# Patient Record
Sex: Female | Born: 1985 | Race: Black or African American | Hispanic: No | Marital: Single | Smoking: Current every day smoker
Health system: Southern US, Community
[De-identification: ages and names within clinical notes are randomized; demographics above are authoritative.]

## PROBLEM LIST (undated history)

## (undated) DIAGNOSIS — E785 Hyperlipidemia, unspecified: Secondary | ICD-10-CM

## (undated) DIAGNOSIS — E119 Type 2 diabetes mellitus without complications: Secondary | ICD-10-CM

## (undated) DIAGNOSIS — K439 Ventral hernia without obstruction or gangrene: Secondary | ICD-10-CM

## (undated) DIAGNOSIS — I10 Essential (primary) hypertension: Secondary | ICD-10-CM

## (undated) HISTORY — DX: Essential (primary) hypertension: I10

## (undated) HISTORY — DX: Hyperlipidemia, unspecified: E78.5

## (undated) HISTORY — DX: Type 2 diabetes mellitus without complications: E11.9
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
## 2020-07-23 ENCOUNTER — Emergency Department
Admission: EM | Admit: 2020-07-23 | Discharge: 2020-07-23 | Disposition: A | Discharge status: Home / Self Care | Payer: Self-pay | Attending: Emergency Medicine | Admitting: Emergency Medicine

## 2020-07-23 ENCOUNTER — Inpatient Hospital Stay (HOSPITAL_COMMUNITY)
Admission: EM | Admit: 2020-07-23 | Discharge: 2020-07-29 | DRG: 083 | Disposition: A | Discharge status: Home / Self Care | Payer: No Typology Code available for payment source | Attending: General Surgery | Admitting: General Surgery

## 2020-07-23 ENCOUNTER — Other Ambulatory Visit: Payer: Self-pay

## 2020-07-23 ENCOUNTER — Emergency Department: Payer: Self-pay

## 2020-07-23 DIAGNOSIS — E1165 Type 2 diabetes mellitus with hyperglycemia: Secondary | ICD-10-CM | POA: Diagnosis present

## 2020-07-23 DIAGNOSIS — I10 Essential (primary) hypertension: Secondary | ICD-10-CM | POA: Diagnosis present

## 2020-07-23 DIAGNOSIS — R739 Hyperglycemia, unspecified: Secondary | ICD-10-CM | POA: Diagnosis present

## 2020-07-23 DIAGNOSIS — L0292 Furuncle, unspecified: Secondary | ICD-10-CM | POA: Diagnosis present

## 2020-07-23 DIAGNOSIS — H209 Unspecified iridocyclitis: Secondary | ICD-10-CM | POA: Diagnosis present

## 2020-07-23 DIAGNOSIS — H53141 Visual discomfort, right eye: Secondary | ICD-10-CM | POA: Diagnosis present

## 2020-07-23 DIAGNOSIS — M79646 Pain in unspecified finger(s): Secondary | ICD-10-CM

## 2020-07-23 DIAGNOSIS — L02214 Cutaneous abscess of groin: Secondary | ICD-10-CM | POA: Diagnosis present

## 2020-07-23 DIAGNOSIS — M542 Cervicalgia: Secondary | ICD-10-CM | POA: Diagnosis present

## 2020-07-23 DIAGNOSIS — Z23 Encounter for immunization: Secondary | ICD-10-CM | POA: Diagnosis not present

## 2020-07-23 DIAGNOSIS — Z6838 Body mass index (BMI) 38.0-38.9, adult: Secondary | ICD-10-CM | POA: Diagnosis not present

## 2020-07-23 DIAGNOSIS — E785 Hyperlipidemia, unspecified: Secondary | ICD-10-CM | POA: Diagnosis present

## 2020-07-23 DIAGNOSIS — F17210 Nicotine dependence, cigarettes, uncomplicated: Secondary | ICD-10-CM | POA: Diagnosis present

## 2020-07-23 DIAGNOSIS — R109 Unspecified abdominal pain: Secondary | ICD-10-CM | POA: Diagnosis not present

## 2020-07-23 DIAGNOSIS — Y92410 Unspecified street and highway as the place of occurrence of the external cause: Secondary | ICD-10-CM | POA: Diagnosis not present

## 2020-07-23 DIAGNOSIS — F17200 Nicotine dependence, unspecified, uncomplicated: Secondary | ICD-10-CM | POA: Diagnosis not present

## 2020-07-23 DIAGNOSIS — S0081XA Abrasion of other part of head, initial encounter: Secondary | ICD-10-CM | POA: Diagnosis present

## 2020-07-23 DIAGNOSIS — S066X9A Traumatic subarachnoid hemorrhage with loss of consciousness of unspecified duration, initial encounter: Principal | ICD-10-CM | POA: Diagnosis present

## 2020-07-23 DIAGNOSIS — I609 Nontraumatic subarachnoid hemorrhage, unspecified: Secondary | ICD-10-CM

## 2020-07-23 DIAGNOSIS — K439 Ventral hernia without obstruction or gangrene: Secondary | ICD-10-CM | POA: Diagnosis present

## 2020-07-23 DIAGNOSIS — R079 Chest pain, unspecified: Secondary | ICD-10-CM | POA: Insufficient documentation

## 2020-07-23 DIAGNOSIS — S0990XA Unspecified injury of head, initial encounter: Secondary | ICD-10-CM | POA: Insufficient documentation

## 2020-07-23 DIAGNOSIS — Z833 Family history of diabetes mellitus: Secondary | ICD-10-CM | POA: Diagnosis not present

## 2020-07-23 DIAGNOSIS — T1490XA Injury, unspecified, initial encounter: Secondary | ICD-10-CM

## 2020-07-23 HISTORY — DX: Ventral hernia without obstruction or gangrene: K43.9

## 2020-07-23 LAB — CBC
HCT: 38.8 % (ref 36.0–46.0)
Hemoglobin: 13.3 g/dL (ref 12.0–15.0)
MCH: 31.5 pg (ref 26.0–34.0)
MCHC: 34.3 g/dL (ref 30.0–36.0)
MCV: 91.9 fL (ref 80.0–100.0)
Platelets: 295 10*3/uL (ref 150–400)
RBC: 4.22 MIL/uL (ref 3.87–5.11)
RDW: 12.3 % (ref 11.5–15.5)
WBC: 6.6 10*3/uL (ref 4.0–10.5)
nRBC: 0 % (ref 0.0–0.2)

## 2020-07-23 LAB — TROPONIN I (HIGH SENSITIVITY)
Troponin I (High Sensitivity): 5 ng/L (ref <18)
Troponin I (High Sensitivity): 5 ng/L (ref <18)

## 2020-07-23 LAB — COMPREHENSIVE METABOLIC PANEL
ALT: 19 U/L (ref 0–44)
AST: 19 U/L (ref 15–41)
Albumin: 3.9 g/dL (ref 3.5–5.0)
Alkaline Phosphatase: 85 U/L (ref 38–126)
Anion gap: 11 (ref 5–15)
BUN: 7 mg/dL (ref 6–20)
CO2: 24 mmol/L (ref 22–32)
Calcium: 8.9 mg/dL (ref 8.9–10.3)
Chloride: 105 mmol/L (ref 98–111)
Creatinine, Ser: 0.61 mg/dL (ref 0.44–1.00)
GFR, Estimated: >60 mL/min (ref >60)
Glucose, Bld: 348 mg/dL — ABNORMAL HIGH (ref 70–99)
Potassium: 3.6 mmol/L (ref 3.5–5.1)
Sodium: 140 mmol/L (ref 135–145)
Total Bilirubin: 0.5 mg/dL (ref 0.3–1.2)
Total Protein: 7.6 g/dL (ref 6.5–8.1)

## 2020-07-23 LAB — APTT: aPTT: 29 seconds (ref 24–36)

## 2020-07-23 LAB — CBG MONITORING, ED: Glucose-Capillary: 190 mg/dL — ABNORMAL HIGH (ref 70–99)

## 2020-07-23 LAB — HEMOGLOBIN A1C
Hgb A1c MFr Bld: 9.7 % — ABNORMAL HIGH (ref 4.8–5.6)
Mean Plasma Glucose: 231.69 mg/dL

## 2020-07-23 LAB — PROTIME-INR
INR: 0.9 (ref 0.8–1.2)
Prothrombin Time: 11.7 seconds (ref 11.4–15.2)

## 2020-07-23 MED ORDER — ONDANSETRON HCL 4 MG/2ML IJ SOLN: 4.0000 mg | Freq: Four times a day (QID) | INTRAMUSCULAR | Status: DC | PRN

## 2020-07-23 MED ORDER — FENTANYL CITRATE (PF) 100 MCG/2ML IJ SOLN: 75.0000 ug | Freq: Once | INTRAMUSCULAR | Status: DC

## 2020-07-23 MED ORDER — BISACODYL 10 MG RE SUPP: 10.0000 mg | Freq: Every day | RECTAL | Status: DC | PRN

## 2020-07-23 MED ORDER — LACTATED RINGERS IV SOLN: INTRAVENOUS | Status: DC

## 2020-07-23 MED ORDER — LEVETIRACETAM IN NACL 500 MG/100ML IV SOLN
500.0000 mg | Freq: Two times a day (BID) | INTRAVENOUS | Status: DC
  Administered 2020-07-23 – 2020-07-28 (×10): 500 mg via INTRAVENOUS
  Filled 2020-07-23 (×12): qty 100

## 2020-07-23 MED ORDER — OXYCODONE HCL 5 MG PO TABS
5.0000 mg | ORAL_TABLET | ORAL | Status: DC | PRN
  Administered 2020-07-24 – 2020-07-26 (×3): 10 mg via ORAL
  Administered 2020-07-26: 5 mg via ORAL
  Administered 2020-07-27: 10 mg via ORAL
  Administered 2020-07-27: 5 mg via ORAL
  Administered 2020-07-28 – 2020-07-29 (×5): 10 mg via ORAL
  Filled 2020-07-23 (×5): qty 2
  Filled 2020-07-23: qty 1
  Filled 2020-07-23 (×4): qty 2
  Filled 2020-07-23: qty 1

## 2020-07-23 MED ORDER — MORPHINE SULFATE (PF) 2 MG/ML IV SOLN
1.0000 mg | INTRAVENOUS | Status: DC | PRN
  Administered 2020-07-23 – 2020-07-27 (×4): 2 mg via INTRAVENOUS
  Filled 2020-07-23 (×4): qty 1

## 2020-07-23 MED ORDER — PROCHLORPERAZINE EDISYLATE 10 MG/2ML IJ SOLN
5.0000 mg | Freq: Four times a day (QID) | INTRAMUSCULAR | Status: DC | PRN
  Filled 2020-07-23: qty 2

## 2020-07-23 MED ORDER — HYDRALAZINE HCL 20 MG/ML IJ SOLN: 10.0000 mg | INTRAMUSCULAR | Status: DC | PRN

## 2020-07-23 MED ORDER — IOHEXOL 300 MG/ML  SOLN
100.0000 mL | Freq: Once | INTRAMUSCULAR | Status: AC | PRN
  Administered 2020-07-23: 100 mL via INTRAVENOUS

## 2020-07-23 MED ORDER — PROCHLORPERAZINE MALEATE 10 MG PO TABS
10.0000 mg | ORAL_TABLET | Freq: Four times a day (QID) | ORAL | Status: DC | PRN
  Filled 2020-07-23: qty 1

## 2020-07-23 MED ORDER — ONDANSETRON 4 MG PO TBDP: 4.0000 mg | ORAL_TABLET | Freq: Four times a day (QID) | ORAL | Status: DC | PRN

## 2020-07-23 MED ORDER — ACETAMINOPHEN 325 MG PO TABS: 650.0000 mg | ORAL_TABLET | ORAL | Status: DC | PRN

## 2020-07-23 MED ORDER — INSULIN ASPART 100 UNIT/ML ~~LOC~~ SOLN
0.0000 [IU] | SUBCUTANEOUS | Status: DC
  Administered 2020-07-24: 1 [IU] via SUBCUTANEOUS
  Administered 2020-07-24: 2 [IU] via SUBCUTANEOUS
  Administered 2020-07-24: 3 [IU] via SUBCUTANEOUS
  Administered 2020-07-24 – 2020-07-25 (×2): 5 [IU] via SUBCUTANEOUS
  Administered 2020-07-25: 2 [IU] via SUBCUTANEOUS
  Administered 2020-07-25 (×2): 1 [IU] via SUBCUTANEOUS
  Administered 2020-07-25: 2 [IU] via SUBCUTANEOUS
  Administered 2020-07-25 – 2020-07-26 (×3): 3 [IU] via SUBCUTANEOUS
  Administered 2020-07-26: 1 [IU] via SUBCUTANEOUS
  Administered 2020-07-26 (×4): 2 [IU] via SUBCUTANEOUS
  Administered 2020-07-27 (×2): 5 [IU] via SUBCUTANEOUS
  Administered 2020-07-27: 2 [IU] via SUBCUTANEOUS
  Administered 2020-07-27: 1 [IU] via SUBCUTANEOUS

## 2020-07-23 MED ORDER — DOCUSATE SODIUM 100 MG PO CAPS
100.0000 mg | ORAL_CAPSULE | Freq: Two times a day (BID) | ORAL | Status: DC
  Administered 2020-07-24 – 2020-07-28 (×6): 100 mg via ORAL
  Filled 2020-07-23 (×10): qty 1

## 2020-07-23 MED ORDER — FENTANYL CITRATE (PF) 100 MCG/2ML IJ SOLN
50.0000 ug | Freq: Once | INTRAMUSCULAR | Status: AC
  Administered 2020-07-23: 50 ug via INTRAVENOUS
  Filled 2020-07-23: qty 2
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
## 2020-07-23 NOTE — ED Notes (Signed)
Patient signed paper consent to be transferred. Copy placed in patient chart.
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
## 2020-07-23 NOTE — ED Notes (Signed)
Spoke to radiology, patient cannot pass c-spine to be able to get up to provide urine sample. Patient refuses cath or bed pan. States that there is no chance at all she could be pregnant. Radiology to discuss waiver with patient.
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
## 2020-07-23 NOTE — ED Provider Notes (Signed)
Transport by Bud Face, MD 07/23/20 [phone]
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
## 2020-07-23 NOTE — ED Notes (Signed)
CBG was 385 per RN
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
## 2020-07-23 NOTE — ED Provider Notes (Signed)
Patient reports no past medical history.  No history of renal disease.  Denies pregnancy or any chance of pregnancy.  Based on her presentation, severity of pain, head injury, intra-abdominal pain and a high pretest probability for acute traumatic injury I have requested emergent CT imaging be performed   Sharyn Creamer, MD 07/23/20 1314

## 2020-07-24 ENCOUNTER — Inpatient Hospital Stay (HOSPITAL_COMMUNITY): Payer: No Typology Code available for payment source

## 2020-07-24 DIAGNOSIS — R030 Elevated blood-pressure reading, without diagnosis of hypertension: Secondary | ICD-10-CM

## 2020-07-24 DIAGNOSIS — E119 Type 2 diabetes mellitus without complications: Secondary | ICD-10-CM

## 2020-07-24 LAB — CBG MONITORING, ED
Glucose-Capillary: 139 mg/dL — ABNORMAL HIGH (ref 70–99)
Glucose-Capillary: 148 mg/dL — ABNORMAL HIGH (ref 70–99)
Glucose-Capillary: 203 mg/dL — ABNORMAL HIGH (ref 70–99)
Glucose-Capillary: 186 mg/dL — ABNORMAL HIGH (ref 70–99)
Glucose-Capillary: 179 mg/dL — ABNORMAL HIGH (ref 70–99)

## 2020-07-24 LAB — CBC
HCT: 41.8 % (ref 36.0–46.0)
Hemoglobin: 13.9 g/dL (ref 12.0–15.0)
MCH: 31.4 pg (ref 26.0–34.0)
MCHC: 33.3 g/dL (ref 30.0–36.0)
MCV: 94.6 fL (ref 80.0–100.0)
Platelets: 299 10*3/uL (ref 150–400)
RBC: 4.42 MIL/uL (ref 3.87–5.11)
RDW: 12.8 % (ref 11.5–15.5)
WBC: 9.6 10*3/uL (ref 4.0–10.5)
nRBC: 0 % (ref 0.0–0.2)

## 2020-07-24 LAB — GLUCOSE, CAPILLARY: Glucose-Capillary: 282 mg/dL — ABNORMAL HIGH (ref 70–99)

## 2020-07-24 LAB — BASIC METABOLIC PANEL
Anion gap: 11 (ref 5–15)
BUN: 6 mg/dL (ref 6–20)
CO2: 26 mmol/L (ref 22–32)
Calcium: 9.3 mg/dL (ref 8.9–10.3)
Chloride: 105 mmol/L (ref 98–111)
Creatinine, Ser: 0.83 mg/dL (ref 0.44–1.00)
GFR, Estimated: >60 mL/min (ref >60)
Glucose, Bld: 179 mg/dL — ABNORMAL HIGH (ref 70–99)
Potassium: 3.7 mmol/L (ref 3.5–5.1)
Sodium: 142 mmol/L (ref 135–145)

## 2020-07-24 LAB — HIV ANTIBODY (ROUTINE TESTING W REFLEX): HIV Screen 4th Generation wRfx: NONREACTIVE

## 2020-07-24 LAB — LIPID PANEL
Cholesterol: 213 mg/dL — ABNORMAL HIGH (ref 0–200)
HDL: 68 mg/dL (ref >40)
LDL Cholesterol: 118 mg/dL — ABNORMAL HIGH (ref 0–99)
Total CHOL/HDL Ratio: 3.1 RATIO
Triglycerides: 135 mg/dL (ref <150)
VLDL: 27 mg/dL (ref 0–40)

## 2020-07-24 MED ORDER — LIVING WELL WITH DIABETES BOOK
Freq: Once | Status: AC
  Filled 2020-07-24: qty 1

## 2020-07-24 MED ORDER — ACETAMINOPHEN 325 MG PO TABS
650.0000 mg | ORAL_TABLET | Freq: Four times a day (QID) | ORAL | Status: DC
  Administered 2020-07-24 – 2020-07-28 (×9): 650 mg via ORAL
  Filled 2020-07-24 (×13): qty 2

## 2020-07-24 MED ORDER — METHOCARBAMOL 500 MG PO TABS
500.0000 mg | ORAL_TABLET | Freq: Three times a day (TID) | ORAL | Status: DC
  Administered 2020-07-24 – 2020-07-29 (×16): 500 mg via ORAL
  Filled 2020-07-24 (×16): qty 1

## 2020-07-24 NOTE — Progress Notes (Addendum)
Inpatient Diabetes Program Recommendations  AACE/ADA: New Consensus Statement on Inpatient Glycemic Control (2015)  Target Ranges:  Prepandial:   less than 140 mg/dL      Peak postprandial:   less than 180 mg/dL (1-2 hours)      Critically ill patients:  140 - 180 mg/dL   Lab Results  Component Value Date   GLUCAP 148 (H) 07/24/2020   HGBA1C 9.7 (H) 07/23/2020    Review of Glycemic Control  Diabetes history: New-onset DM Outpatient Diabetes medications: None Current orders for Inpatient glycemic control: Novolog 0-9 units Q4H  New onset DM with HgbA1C of 9.7%. Ordered Living Well with Diabetes book  No more nausea - on CL diet.  Inpatient Diabetes Program Recommendations:     Agree with orders. As po intake increases, may need meal coverage insulin and/or low- dose basal insulin. Will need to f/u with PCP for new-onset DM.  Diabetes Coordinator will attempt to speak with pt when appropriate about new DM diagnosis. Will need to stress lifestyle modification with diet, exercise, weight loss to help to control blood sugars.   Follow.  Thank you. Ailene Ards, RD, LDN, CDE Inpatient Diabetes Coordinator [phone]  Addendum: Spoke with pt about new diagnosis of diabetes. Pt states she will need PCP to manage her diabetes. Has insurance through work. Long discussion on lifestyle modification, weight loss, exercise. Will need to check blood sugars at home and will likely need insulin.   Will follow-up on Mon, 11/15.  RV

## 2020-07-24 NOTE — ED Notes (Signed)
Lunch Tray Ordered @ 1135. 

## 2020-07-24 NOTE — ED Notes (Signed)
Called pharmacy to check on status of Keppra, was told they would resend at this time
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
## 2020-07-24 NOTE — Progress Notes (Signed)
CT brain reviewed this morning. No acute changes. Stable subarachnoid hemorrhage.   Patient is neurological improving, GCS 15 at this time. Sitting up and eating a meal. No focal weakness. Still right periOrbital edema.  No further neurosurgical intervention. Can complete seven days of Keppra and then discontinue. No neurosurgical follow up necessary.   Thank you for allowing me to participate in this patient's c are. Yeslin Delio, DO  Please don't hesitate to call with questions.
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
## 2020-07-24 NOTE — ED Notes (Signed)
Keppra requested from pharmacy at this time.

## 2020-07-24 NOTE — Progress Notes (Signed)
Patient arrived to 4E from ED. Patient oriented to room and staff. CCMD notified. Telemetry box applied. CHG bath completed. Pain medication given per patient request. Vital signs stable. Call bell in reach.  Kenard Gower, RN
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
## 2020-07-25 DIAGNOSIS — E785 Hyperlipidemia, unspecified: Secondary | ICD-10-CM

## 2020-07-25 DIAGNOSIS — E1169 Type 2 diabetes mellitus with other specified complication: Secondary | ICD-10-CM

## 2020-07-25 LAB — GLUCOSE, CAPILLARY
Glucose-Capillary: 155 mg/dL — ABNORMAL HIGH (ref 70–99)
Glucose-Capillary: 289 mg/dL — ABNORMAL HIGH (ref 70–99)

## 2020-07-25 LAB — URINALYSIS, ROUTINE W REFLEX MICROSCOPIC
Bilirubin Urine: NEGATIVE
Glucose, UA: 50 mg/dL — AB
Hgb urine dipstick: NEGATIVE
Ketones, ur: NEGATIVE mg/dL
Leukocytes,Ua: NEGATIVE
Nitrite: NEGATIVE
Protein, ur: NEGATIVE mg/dL
Specific Gravity, Urine: 1.01 (ref 1.005–1.030)
pH: 7 (ref 5.0–8.0)

## 2020-07-25 MED ORDER — INSULIN GLARGINE 100 UNIT/ML ~~LOC~~ SOLN
10.0000 [IU] | Freq: Every day | SUBCUTANEOUS | Status: DC
  Administered 2020-07-25 – 2020-07-27 (×3): 10 [IU] via SUBCUTANEOUS
  Filled 2020-07-25 (×5): qty 0.1

## 2020-07-25 MED ORDER — DIPHENHYDRAMINE HCL 25 MG PO CAPS
25.0000 mg | ORAL_CAPSULE | Freq: Four times a day (QID) | ORAL | Status: DC | PRN
  Administered 2020-07-25 – 2020-07-26 (×3): 25 mg via ORAL
  Filled 2020-07-25 (×3): qty 1

## 2020-07-25 MED ORDER — ATORVASTATIN CALCIUM 40 MG PO TABS
40.0000 mg | ORAL_TABLET | Freq: Every day | ORAL | Status: DC
  Administered 2020-07-25 – 2020-07-29 (×5): 40 mg via ORAL
  Filled 2020-07-25: qty 1
  Filled 2020-07-25: qty 4
  Filled 2020-07-25 (×3): qty 1

## 2020-07-25 MED ORDER — AMLODIPINE BESYLATE 5 MG PO TABS
5.0000 mg | ORAL_TABLET | Freq: Every day | ORAL | Status: DC
  Administered 2020-07-25 – 2020-07-26 (×2): 5 mg via ORAL
  Filled 2020-07-25 (×2): qty 1
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
## 2020-07-25 NOTE — Plan of Care (Signed)
Continue to monitor
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
## 2020-07-25 NOTE — Progress Notes (Signed)
Patient reports itching and burning all over. Similar incident occurred last night and was given benadryl. Spoke with pharmacist who states that her medications should not cause itching but it is individual based. No other symptoms. Benadryl given at this time. Patient said she also had some "seasoning" from the food tray last night and today and wonders if this could be the cause. I explained to patient to hold off on seasoning and see if that helps or happens again. Will continue to monitor.

## 2020-07-25 NOTE — Progress Notes (Signed)
Patient refused education today on checking her blood glucose and giving herself insulin. She states that her eyes are still swollen and can't see well enough. She states she will try tomorrow. No family at bedside to teach either.

## 2020-07-26 DIAGNOSIS — R739 Hyperglycemia, unspecified: Secondary | ICD-10-CM

## 2020-07-26 LAB — CBC
HCT: 39.9 % (ref 36.0–46.0)
Hemoglobin: 13.2 g/dL (ref 12.0–15.0)
MCH: 31.2 pg (ref 26.0–34.0)
MCHC: 33.1 g/dL (ref 30.0–36.0)
MCV: 94.3 fL (ref 80.0–100.0)
Platelets: 250 10*3/uL (ref 150–400)
RBC: 4.23 MIL/uL (ref 3.87–5.11)
RDW: 12.3 % (ref 11.5–15.5)
WBC: 7.2 10*3/uL (ref 4.0–10.5)
nRBC: 0 % (ref 0.0–0.2)

## 2020-07-26 LAB — BASIC METABOLIC PANEL
Anion gap: 8 (ref 5–15)
BUN: 8 mg/dL (ref 6–20)
CO2: 27 mmol/L (ref 22–32)
Calcium: 9 mg/dL (ref 8.9–10.3)
Chloride: 103 mmol/L (ref 98–111)
Creatinine, Ser: 0.85 mg/dL (ref 0.44–1.00)
GFR, Estimated: >60 mL/min (ref >60)
Glucose, Bld: 191 mg/dL — ABNORMAL HIGH (ref 70–99)
Potassium: 3.5 mmol/L (ref 3.5–5.1)
Sodium: 138 mmol/L (ref 135–145)

## 2020-07-26 MED ORDER — AMLODIPINE BESYLATE 10 MG PO TABS
10.0000 mg | ORAL_TABLET | Freq: Every day | ORAL | Status: DC
  Administered 2020-07-27 – 2020-07-29 (×3): 10 mg via ORAL
  Filled 2020-07-26 (×3): qty 1

## 2020-07-26 NOTE — Plan of Care (Signed)
Continue to monitor

## 2020-07-26 NOTE — TOC CAGE-AID Note (Signed)
Transition of Care [facility]) - CAGE-AID Screening   Patient Details  Name: Jasmine Short MRN: [id number] Date of Birth: [birth year]  Transition of Care [facility]) CM/SW Contact:    Emeterio Reeve, [place] Phone Number: 07/26/2020, 4:51 PM   Clinical Narrative:  CSW met with pt at bedside. CSW introduced self and explained role at the hospital.  Pt denies alcohol use. Pt denies substance use. Pt did not need any resources at this time.    CAGE-AID Screening:    Have You Ever Felt You Ought to Cut Down on Your Drinking or Drug Use?: No Have People Annoyed You By Critizing Your Drinking Or Drug Use?: No Have You Felt Bad Or Guilty About Your Drinking Or Drug Use?: No Have You Ever Had a Drink or Used Drugs First Thing In The Morning to Steady Your Nerves or to Get Rid of a Hangover?: No CAGE-AID Score: 0  Substance Abuse Education Offered: Yes     Blima Ledger, [place] Social Worker [phone]
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
## 2020-07-26 NOTE — Progress Notes (Signed)
Patient unable to do self injections or blood glucose checks today d/t her eyes. States they hurt and she cant open very much d/t light sensitivity. Patient states that she will continue to try daily and family will be here at discharge to teach how to help manage diabetes.
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
## 2020-07-27 LAB — MICROALBUMIN / CREATININE URINE RATIO
Creatinine, Urine: 63.4 mg/dL
Microalb Creat Ratio: <5 mg/g creat (ref 0–29)
Microalb, Ur: <3 ug/mL — ABNORMAL HIGH

## 2020-07-27 LAB — GLUCOSE, CAPILLARY
Glucose-Capillary: 292 mg/dL — ABNORMAL HIGH (ref 70–99)
Glucose-Capillary: 152 mg/dL — ABNORMAL HIGH (ref 70–99)
Glucose-Capillary: 210 mg/dL — ABNORMAL HIGH (ref 70–99)

## 2020-07-27 MED ORDER — ERYTHROMYCIN 5 MG/GM OP OINT
TOPICAL_OINTMENT | Freq: Four times a day (QID) | OPHTHALMIC | Status: DC
  Filled 2020-07-27: qty 3.5

## 2020-07-27 MED ORDER — LIVING WELL WITH DIABETES BOOK
Freq: Once | Status: AC
  Filled 2020-07-27: qty 1

## 2020-07-27 MED ORDER — INSULIN ASPART 100 UNIT/ML ~~LOC~~ SOLN
0.0000 [IU] | Freq: Every day | SUBCUTANEOUS | Status: DC
  Administered 2020-07-27: 2 [IU] via SUBCUTANEOUS

## 2020-07-27 MED ORDER — INSULIN GLARGINE 100 UNIT/ML ~~LOC~~ SOLN
18.0000 [IU] | Freq: Every day | SUBCUTANEOUS | Status: DC
  Administered 2020-07-28 – 2020-07-29 (×2): 18 [IU] via SUBCUTANEOUS
  Filled 2020-07-27 (×2): qty 0.18

## 2020-07-27 MED ORDER — ENOXAPARIN SODIUM 30 MG/0.3ML ~~LOC~~ SOLN
30.0000 mg | Freq: Two times a day (BID) | SUBCUTANEOUS | Status: DC
  Administered 2020-07-27 – 2020-07-29 (×4): 30 mg via SUBCUTANEOUS
  Filled 2020-07-27 (×4): qty 0.3

## 2020-07-27 MED ORDER — PREDNISOLONE ACETATE 1 % OP SUSP
1.0000 [drp] | Freq: Four times a day (QID) | OPHTHALMIC | Status: DC
  Administered 2020-07-27 – 2020-07-29 (×6): 1 [drp] via OPHTHALMIC
  Filled 2020-07-27: qty 5

## 2020-07-27 MED ORDER — INSULIN ASPART 100 UNIT/ML ~~LOC~~ SOLN
0.0000 [IU] | Freq: Three times a day (TID) | SUBCUTANEOUS | Status: DC
  Administered 2020-07-28: 5 [IU] via SUBCUTANEOUS
  Administered 2020-07-28 – 2020-07-29 (×4): 3 [IU] via SUBCUTANEOUS
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
## 2020-07-27 NOTE — Progress Notes (Signed)
Inpatient Diabetes Program Recommendations  AACE/ADA: New Consensus Statement on Inpatient Glycemic Control (2015)  Target Ranges:  Prepandial:   less than 140 mg/dL      Peak postprandial:   less than 180 mg/dL (1-2 hours)      Critically ill patients:  140 - 180 mg/dL   Lab Results  Component Value Date   GLUCAP 292 (H) 07/27/2020   HGBA1C 9.7 (H) 07/23/2020    Review of Glycemic Control  Inpatient Diabetes Program Recommendations:   Attempted to speak with patient educate on diabetes type 2 + insulin teaching to prepare patient if discharged home on insulin. Patient states "my eyes hurt too much to talk now" and did not uncover face @ this time. Patient did acknowledge she knew she had diagnosis of diabetes but not wanting to discuss further @ this time.  Thank you, Billy Fischer. Reka Wist, RN, MSN, CDE  Diabetes Coordinator Inpatient Glycemic Control Team Team Pager [phone] (8am-5pm) 07/27/2020 1:00 PM
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
## 2020-07-27 NOTE — Plan of Care (Signed)
°  RD consulted for nutrition education regarding diabetes.   Lab Results  Component Value Date   HGBA1C 9.7 (H) 07/23/2020    RD provided "Carbohydrate Counting for People with Diabetes" handout from the Academy of Nutrition and Dietetics. Discussed different food groups and their effects on blood sugar, emphasizing carbohydrate-containing foods. Provided list of carbohydrates and recommended serving sizes of common foods.  Discussed importance of controlled and consistent carbohydrate intake throughout the day. Provided examples of ways to balance meals/snacks and encouraged intake of high-fiber, whole grain complex carbohydrates. Teach back method used.  Patient endorses eating one meal with snacks daily. Meal consist of fast food restaurants such as [employer]. Patient typically drinks soda, juice, and water. Dicussed how to make meal balanced, which foods contain carbs, how to count carbs, and how to make appropriate beverage substitutions. Patient would benefit from further outpatient education.    Labs and medications reviewed. No further nutrition interventions warranted at this time. RD contact information provided. If additional nutrition issues arise, please re-consult RD.  Vanessa Kick RD, LDN Clinical Nutrition Pager listed in AMION

## 2020-07-27 NOTE — Progress Notes (Addendum)
Inpatient Diabetes Program Recommendations  AACE/ADA: New Consensus Statement on Inpatient Glycemic Control (2015)  Target Ranges:  Prepandial:   less than 140 mg/dL      Peak postprandial:   less than 180 mg/dL (1-2 hours)      Critically ill patients:  140 - 180 mg/dL   Lab Results  Component Value Date   GLUCAP 292 (H) 07/27/2020   HGBA1C 9.7 (H) 07/23/2020    Review of Glycemic Control Results for Jasmine Short, Jasmine Short (MRN [id number]) as of 07/27/2020 12:02  Ref. Range 07/27/2020 08:35  Glucose-Capillary Latest Ref Range: 70 - 99 mg/dL 128 (H)   Diabetes history: New onset DM Outpatient Diabetes medications: none Current orders for Inpatient glycemic control: Lantus 10 units QD, Novolog 0-9 units Q4H  Inpatient Diabetes Program Recommendations:    Consider increasing Lantus to 18 units QHS and changing correction to Novolog 0-15 units TID & HS.   Noted new onset and multiple attempts for insulin teaching per nursing. Secure chat sent to RN to continue to attempt, DM coordinator to plan to see patient today. LWWDM booklet ordered, TOC and dietitian consults placed. Also, discussed lack of CBGs with RN. Encouraged meter docking and to ensure correction is given based on fingerstick, not serum glucose. Following.    Thanks, Lujean Rave, MSN, RNC-OB Diabetes Coordinator [phone] (8a-5p)
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
## 2020-07-27 NOTE — Progress Notes (Signed)
Patient refusing medication and vitals at this time. Informed patient staff would return in 30 minutes.  Kenard Gower, RN
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
## 2020-07-28 LAB — GLUCOSE, CAPILLARY: Glucose-Capillary: 184 mg/dL — ABNORMAL HIGH (ref 70–99)

## 2020-07-28 MED ORDER — INFLUENZA VAC SPLIT QUAD 0.5 ML IM SUSY
0.5000 mL | PREFILLED_SYRINGE | INTRAMUSCULAR | Status: AC
  Administered 2020-07-29: 0.5 mL via INTRAMUSCULAR
  Filled 2020-07-28: qty 0.5

## 2020-07-28 MED ORDER — LIDOCAINE HCL (PF) 1 % IJ SOLN
INTRAMUSCULAR | Status: AC
  Filled 2020-07-28: qty 5

## 2020-07-28 MED ORDER — LIDOCAINE HCL 1 % IJ SOLN
5.0000 mL | Freq: Once | INTRAMUSCULAR | Status: DC
  Filled 2020-07-28: qty 5

## 2020-07-28 MED ORDER — LEVETIRACETAM 500 MG PO TABS
500.0000 mg | ORAL_TABLET | Freq: Two times a day (BID) | ORAL | Status: DC
  Administered 2020-07-28 – 2020-07-29 (×2): 500 mg via ORAL
  Filled 2020-07-28 (×2): qty 1

## 2020-07-28 MED ORDER — SULFAMETHOXAZOLE-TRIMETHOPRIM 800-160 MG PO TABS
1.0000 | ORAL_TABLET | Freq: Two times a day (BID) | ORAL | Status: DC
  Administered 2020-07-28 – 2020-07-29 (×2): 1 via ORAL
  Filled 2020-07-28 (×2): qty 1
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
## 2020-07-28 NOTE — TOC Progression Note (Signed)
Transition of Care (TOC) - Progression Note  Sander Radon, BSN Transitions of Care Unit 4E- RN Case Manager See Treatment Team for direct phone #    Patient Details  Name: Lakelyn Straus MRN: [id number] Date of Birth: [birth year]  Transition of Care [facility]) CM/SW Contact  Zenda Alpers Lenn Sink, RN Phone Number: 07/28/2020, 2:48 PM  Clinical Narrative:    Follow up done with pt for PCP needs- per pt she would like to find a primary care provider near [place] or [place]- confirmed address and phone # in epic. Per pt she has started new job in last 5 mo. And just reached 90 day mark for insurance - she will start insurance coverage when she leaves hospital and can go by HR at her job to get information. At this time all she knows is that it will be a Insurance underwriter.  Call made to several practices in/near [place]- however either they are not taking new patients, need more info on her insurance, or are out to Jan/first of year for new patient appointments- info provided to pt on some of the clinics and advised her to get the new insurance info as soon as possible that way she can find out who is in network with her insurance and find a provider close to [place] area. Pt voiced understanding and will f/u to find a provider and make an appointment as soon as possible.  Will need to use [facility] pharmacy for any new scripts- TOC to follow to see if pt will need MATCH assistance.    Expected Discharge Plan: Home/Self Care Barriers to Discharge: Continued Medical Work up  Expected Discharge Plan and Services Expected Discharge Plan: Home/Self Care   Discharge Planning Services: CM Consult, Indigent Health Clinic   Living arrangements for the past 2 months: Apartment                                       Social Determinants of Health (SDOH) Interventions    Readmission Risk Interventions No flowsheet data found.

## 2020-07-28 NOTE — Progress Notes (Signed)
Patient with new onset rash on arms and chest as well as new abscess on left groin. Patient states painful and itchy. Assessed by this nurse and Audley Hose, RN. Origin undetermined and physician notified. Awaiting response and will continue to monitor.   -Estella Husk, RN

## 2020-07-28 NOTE — Progress Notes (Addendum)
Inpatient Diabetes Program Recommendations  AACE/ADA: New Consensus Statement on Inpatient Glycemic Control (2015)  Target Ranges:  Prepandial:   less than 140 mg/dL      Peak postprandial:   less than 180 mg/dL (1-2 hours)      Critically ill patients:  140 - 180 mg/dL   Lab Results  Component Value Date   GLUCAP 184 (H) 07/28/2020   HGBA1C 9.7 (H) 07/23/2020    Review of Glycemic Control Results for RYELEIGH, SANTORE (MRN [id number]) as of 07/28/2020 11:55  Ref. Range 07/27/2020 08:35 07/27/2020 17:56 07/27/2020 21:45 07/28/2020 06:23  Glucose-Capillary Latest Ref Range: 70 - 99 mg/dL 292 (H) 152 (H) 210 (H) 184 (H)   Diabetes history: New onset DM Outpatient Diabetes medications: none Current orders for Inpatient glycemic control: Lantus 18 units QD, Novolog 0-15 units TID & HS  Inpatient Diabetes Program Recommendations:    Spoke with patient regarding new onset DM.  Reviewed patient's current A1c of 9.7%. Explained what a A1c is and what it measures. Also reviewed goal A1c with patient, importance of good glucose control @ home, and blood sugar goals. Reviewed at length DM, hypo vs hyper glycemia, interventions, Relion products, differences between long acting vs short acting insulin, Metformin/Glipizide, vascular changes and commorbidities.  Patient will need a meter at discharge. Blood glucose meter (inlcudes strips and lancets) #[id number]. Encouraged to being checking blood sugars 2-3 times per day and reviewed target goals and when to call MD.  Patient had recently discontinued consuming regular sodas, however, was continuing to drink large amounts of juice. Reviewed alternatives, plate method, reading nutritional labels, daily alottment, and importance of CHO mindfulness. Reviewed daily schedule and how to fit in meals and reviewed meal/snack options.  Educated patient on insulin pen use at home. Reviewed contents of insulin flexpen starter kit. Reviewed all steps if insulin pen  including attachment of needle, 2-unit air shot, dialing up dose, giving injection, removing needle, disposal of sharps, storage of unused insulin, disposal of insulin etc. Patient able to provide successful return demonstration. Also reviewed troubleshooting with insulin pen. MD to give patient Rxs for insulin pens and insulin pen needles. RN to plan to work with patient on self injection today and patient willing. Patient is planning to follow up with PCP, however, has multiple questions regarding insurance status. Reached out to [place], case management to help assist. This will also aide in plan for discharge, although, I suspect patient may benefit form oral agents and possible need for insulin. Patient has no further questions at this time.   Thanks, Bronson Curb, MSN, RNC-OB Diabetes Coordinator [phone] (8a-5p)
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
## 2020-07-29 ENCOUNTER — Other Ambulatory Visit (HOSPITAL_COMMUNITY): Payer: Self-pay | Admitting: Physician Assistant

## 2020-07-29 LAB — GLUCOSE, CAPILLARY: Glucose-Capillary: 168 mg/dL — ABNORMAL HIGH (ref 70–99)

## 2020-07-29 MED ORDER — METHOCARBAMOL 500 MG PO TABS: 500.0000 mg | ORAL_TABLET | Freq: Three times a day (TID) | ORAL | 0 refills | Status: DC | PRN

## 2020-07-29 MED ORDER — INSULIN GLARGINE 100 UNIT/ML ~~LOC~~ SOLN: 18.0000 [IU] | Freq: Every day | SUBCUTANEOUS | 11 refills | Status: AC

## 2020-07-29 MED ORDER — LEVETIRACETAM 500 MG PO TABS
500.0000 mg | ORAL_TABLET | Freq: Two times a day (BID) | ORAL | 0 refills | Status: DC
Start: 2020-07-29 — End: 2020-07-29

## 2020-07-29 MED ORDER — PREDNISOLONE ACETATE 1 % OP SUSP: 1.0000 [drp] | Freq: Four times a day (QID) | OPHTHALMIC | 0 refills | Status: DC

## 2020-07-29 MED ORDER — AMLODIPINE BESYLATE 10 MG PO TABS
10.0000 mg | ORAL_TABLET | Freq: Every day | ORAL | 0 refills | Status: DC
Start: 2020-07-30 — End: 2020-07-30

## 2020-07-29 MED ORDER — INSULIN ASPART 100 UNIT/ML ~~LOC~~ SOLN
0.0000 [IU] | Freq: Every day | SUBCUTANEOUS | 11 refills | Status: DC
Start: 2020-07-29 — End: 2020-11-06

## 2020-07-29 MED ORDER — ATORVASTATIN CALCIUM 40 MG PO TABS
40.0000 mg | ORAL_TABLET | Freq: Every day | ORAL | 0 refills | Status: DC
Start: 2020-07-30 — End: 2020-07-30

## 2020-07-29 MED ORDER — INSULIN ASPART 100 UNIT/ML ~~LOC~~ SOLN
0.0000 [IU] | Freq: Three times a day (TID) | SUBCUTANEOUS | 11 refills | Status: AC
Start: 2020-07-29 — End: ?

## 2020-07-29 MED ORDER — OXYCODONE HCL 5 MG PO TABS: 5.0000 mg | ORAL_TABLET | Freq: Four times a day (QID) | ORAL | 0 refills | Status: DC | PRN

## 2020-07-29 MED ORDER — BLOOD GLUCOSE METER KIT: PACK | 0 refills | Status: AC

## 2020-07-29 MED ORDER — ACETAMINOPHEN 325 MG PO TABS: 650.0000 mg | ORAL_TABLET | Freq: Four times a day (QID) | ORAL | Status: AC | PRN

## 2020-07-29 MED ORDER — SULFAMETHOXAZOLE-TRIMETHOPRIM 800-160 MG PO TABS
1.0000 | ORAL_TABLET | Freq: Two times a day (BID) | ORAL | 0 refills | Status: DC
Start: 2020-07-29 — End: 2020-08-11

## 2020-07-29 MED FILL — NOVOLOG FLEXPEN SYRINGE: 100 | 30 days supply | Qty: 15 | Fill #0

## 2020-07-29 MED FILL — PENTIPS 32G X 4 MM MISC: 32G X 4 MM | 30 days supply | Qty: 200 | Fill #0

## 2020-07-29 MED FILL — TRUEplus LANCETS 28G MISC: 25 days supply | Qty: 100 | Fill #0

## 2020-07-29 MED FILL — oxyCODONE HCL 5 MG TABS: 5 | 3 days supply | Qty: 15 | Fill #0

## 2020-07-29 MED FILL — METHOCARBAMOL 500 MG TABS: 500 | 10 days supply | Qty: 30 | Fill #0

## 2020-07-29 MED FILL — TRUE METRIX BLOOD GLUCOSE M: W/DEVICE | 1 days supply | Qty: 1 | Fill #0

## 2020-07-29 MED FILL — SULFAMETHOXAZOLE-TMP DS TAB: 800-160 | 6 days supply | Qty: 12 | Fill #0

## 2020-07-29 MED FILL — TRUE METRIX GLUCOSE TEST ST: 25 days supply | Qty: 100 | Fill #0

## 2020-07-29 MED FILL — ATORVASTATIN CALCIUM 40 MG: 40 | 30 days supply | Qty: 30 | Fill #0

## 2020-07-29 MED FILL — AMLODIPINE BESYLATE 10 MG T: 10 | 30 days supply | Qty: 30 | Fill #0

## 2020-07-29 MED FILL — levETIRAcetam 500 MG TABS: 500 | 5 days supply | Qty: 10 | Fill #0

## 2020-07-29 MED FILL — LANTUS SOLOSTAR 100 UNITS/M: 100 | 30 days supply | Qty: 6 | Fill #0
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
## 2020-07-29 NOTE — Progress Notes (Signed)
Patient discharged with all of her items including pharmacy medicatoins. Given discharge instructions and paperwork, instructed to follow up. IV discontinued per protocol.   -Estella Husk, RN

## 2020-07-29 NOTE — Plan of Care (Signed)

## 2020-07-29 NOTE — Discharge Instructions (Signed)
Carbohydrate Counting For People With Diabetes  Foods with carbohydrates make your blood glucose level go up. Learning how to count carbohydrates can help you control your blood glucose levels. First, identify the foods you eat that contain carbohydrates. Then, using the Foods with Carbohydrates chart, determine about how much carbohydrates are in your meals and snacks. Make sure you are eating foods with fiber, protein, and healthy fat along with your carbohydrate foods. Foods with Carbohydrates The following table shows carbohydrate foods that have about 15 grams of carbohydrate each. Using measuring cups, spoons, or a food scale when you first begin learning about carbohydrate counting can help you learn about the portion sizes you typically eat. The following foods have 15 grams carbohydrate each:  Grains . 1 slice bread (1 ounce)  . 1 small tortilla (6-inch size)  .  large bagel (1 ounce)  . 1/3 cup pasta or rice (cooked)  .  hamburger or hot dog bun ( ounce)  .  cup cooked cereal  .  to  cup ready-to-eat cereal  . 2 taco shells (5-inch size) Fruit . 1 small fresh fruit ( to 1 cup)  .  medium banana  . 17 small grapes (3 ounces)  . 1 cup melon or berries  .  cup canned or frozen fruit  . 2 tablespoons dried fruit (blueberries, cherries, cranberries, raisins)  .  cup unsweetened fruit juice  Starchy Vegetables .  cup cooked beans, peas, corn, potatoes/sweet potatoes  .  large baked potato (3 ounces)  . 1 cup acorn or butternut squash  Snack Foods . 3 to 6 crackers  . 8 potato chips or 13 tortilla chips ( ounce to 1 ounce)  . 3 cups popped popcorn  Dairy . 3/4 cup (6 ounces) nonfat plain yogurt, or yogurt with sugar-free sweetener  . 1 cup milk  . 1 cup plain rice, soy, coconut or flavored almond milk Sweets and Desserts .  cup ice cream or frozen yogurt  . 1 tablespoon jam, jelly, pancake syrup, table sugar, or honey  . 2 tablespoons light pancake syrup  . 1 inch  square of frosted cake or 2 inch square of unfrosted cake  . 2 small cookies (2/3 ounce each) or  large cookie  Sometimes you'll have to estimate carbohydrate amounts if you don't know the exact recipe. One cup of mixed foods like soups can have 1 to 2 carbohydrate servings, while some casseroles might have 2 or more servings of carbohydrate. Foods that have less than 20 calories in each serving can be counted as "free" foods. Count 1 cup raw vegetables, or  cup cooked non-starchy vegetables as "free" foods. If you eat 3 or more servings at one meal, then count them as 1 carbohydrate serving.  Foods without Carbohydrates  Not all foods contain carbohydrates. Meat, some dairy, fats, non-starchy vegetables, and many beverages don't contain carbohydrate. So when you count carbohydrates, you can generally exclude chicken, pork, beef, fish, seafood, eggs, tofu, cheese, butter, sour cream, avocado, nuts, seeds, olives, mayonnaise, water, black coffee, unsweetened tea, and zero-calorie drinks. Vegetables with no or low carbohydrate include green beans, cauliflower, tomatoes, and onions. How much carbohydrate should I eat at each meal?  Carbohydrate counting can help you plan your meals and manage your weight. Following are some starting points for carbohydrate intake at each meal. Work with your registered dietitian nutritionist to find the best range that works for your blood glucose and weight.   To Lose Weight To  Maintain Weight  Women 2 - 3 carb servings 3 - 4 carb servings  Men 3 - 4 carb servings 4 - 5 carb servings  Checking your blood glucose after meals will help you know if you need to adjust the timing, type, or number of carbohydrate servings in your meal plan. Achieve and keep a healthy body weight by balancing your food intake and physical activity.  Tips How should I plan my meals?  Plan for half the food on your plate to include non-starchy vegetables, like salad greens, broccoli, or  carrots. Try to eat 3 to 5 servings of non-starchy vegetables every day. Have a protein food at each meal. Protein foods include chicken, fish, meat, eggs, or beans (note that beans contain carbohydrate). These two food groups (non-starchy vegetables and proteins) are low in carbohydrate. If you fill up your plate with these foods, you will eat less carbohydrate but still fill up your stomach. Try to limit your carbohydrate portion to  of the plate.  What fats are healthiest to eat?  Diabetes increases risk for heart disease. To help protect your heart, eat more healthy fats, such as olive oil, nuts, and avocado. Eat less saturated fats like butter, cream, and high-fat meats, like bacon and sausage. Avoid trans fats, which are in all foods that list "partially hydrogenated oil" as an ingredient. What should I drink?  Choose drinks that are not sweetened with sugar. The healthiest choices are water, carbonated or seltzer waters, and tea and coffee without added sugars.  Sweet drinks will make your blood glucose go up very quickly. One serving of soda or energy drink is  cup. It is best to drink these beverages only if your blood glucose is low.  Artificially sweetened, or diet drinks, typically do not increase your blood glucose if they have zero calories in them. Read labels of beverages, as some diet drinks do have carbohydrate and will raise your blood glucose. Label Reading Tips Read Nutrition Facts labels to find out how many grams of carbohydrate are in a food you want to eat. Don't forget: sometimes serving sizes on the label aren't the same as how much food you are going to eat, so you may need to calculate how much carbohydrate is in the food you are serving yourself.   Carbohydrate Counting for People with Diabetes Sample 1-Day Menu  Breakfast  cup yogurt, low fat, low sugar (1 carbohydrate serving)   cup cereal, ready-to-eat, unsweetened (1 carbohydrate serving)  1 cup strawberries (1  carbohydrate serving)   cup almonds ( carbohydrate serving)  Lunch 1, 5 ounce can chunk light tuna  2 ounces cheese, low fat cheddar  6 whole wheat crackers (1 carbohydrate serving)  1 small apple (1 carbohydrate servings)   cup carrots ( carbohydrate serving)   cup snap peas  1 cup 1% milk (1 carbohydrate serving)   Evening Meal Stir fry made with: 3 ounces chicken  1 cup brown rice (3 carbohydrate servings)   cup broccoli ( carbohydrate serving)   cup green beans   cup onions  1 tablespoon olive oil  2 tablespoons teriyaki sauce ( carbohydrate serving)  Evening Snack 1 extra small banana (1 carbohydrate serving)  1 tablespoon peanut butter   Carbohydrate Counting for People with Diabetes Vegan Sample 1-Day Menu  Breakfast 1 cup cooked oatmeal (2 carbohydrate servings)   cup blueberries (1 carbohydrate serving)  2 tablespoons flaxseeds  1 cup soymilk fortified with calcium and vitamin D  1  cup coffee  Lunch 2 slices whole wheat bread (2 carbohydrate servings)   cup baked tofu   cup lettuce  2 slices tomato  2 slices avocado   cup baby carrots ( carbohydrate serving)  1 orange (1 carbohydrate serving)  1 cup soymilk fortified with calcium and vitamin D   Evening Meal Burrito made with: 1 6-inch corn tortilla (1 carbohydrate serving)  1 cup refried vegetarian beans (2 carbohydrate servings)   cup chopped tomatoes   cup lettuce   cup salsa  1/3 cup brown rice (1 carbohydrate serving)  1 tablespoon olive oil for rice   cup zucchini   Evening Snack 6 small whole grain crackers (1 carbohydrate serving)  2 apricots ( carbohydrate serving)   cup unsalted peanuts ( carbohydrate serving)    Carbohydrate Counting for People with Diabetes Vegetarian (Lacto-Ovo) Sample 1-Day Menu  Breakfast 1 cup cooked oatmeal (2 carbohydrate servings)   cup blueberries (1 carbohydrate serving)  2 tablespoons flaxseeds  1 egg  1 cup 1% milk (1 carbohydrate serving)  1  cup coffee  Lunch 2 slices whole wheat bread (2 carbohydrate servings)  2 ounces low-fat cheese   cup lettuce  2 slices tomato  2 slices avocado   cup baby carrots ( carbohydrate serving)  1 orange (1 carbohydrate serving)  1 cup unsweetened tea  Evening Meal Burrito made with: 1 6-inch corn tortilla (1 carbohydrate serving)   cup refried vegetarian beans (1 carbohydrate serving)   cup tomatoes   cup lettuce   cup salsa  1/3 cup brown rice (1 carbohydrate serving)  1 tablespoon olive oil for rice   cup zucchini  1 cup 1% milk (1 carbohydrate serving)  Evening Snack 6 small whole grain crackers (1 carbohydrate serving)  2 apricots ( carbohydrate serving)   cup unsalted peanuts ( carbohydrate serving)    Copyright 2020  [employer]. All rights reserved.  Using Nutrition Labels: Carbohydrate  . Serving Size  . Look at the serving size. All the information on the label is based on this portion. Randol Kern Per Container  . The number of servings contained in the package. . Guidelines for Carbohydrate  . Look at the total grams of carbohydrate in the serving size.  . 1 carbohydrate choice = 15 grams of carbohydrate. Range of Carbohydrate Grams Per Choice  Carbohydrate Grams/Choice Carbohydrate Choices  6-10   11-20 1   Blood Glucose Monitoring, Adult Monitoring your blood sugar (glucose) is an important part of managing your diabetes (diabetes mellitus). Blood glucose monitoring involves checking your blood glucose as often as directed and keeping a record (log) of your results over time. Checking your blood glucose regularly and keeping a blood glucose log can:  Help you and your health care provider adjust your diabetes management plan as needed, including your medicines or insulin.  Help you understand how food, exercise, illnesses, and medicines affect your blood glucose.  Let you know what your blood glucose is at any time. You can  quickly find out if you have low blood glucose (hypoglycemia) or high blood glucose (hyperglycemia). Your health care provider will set individualized treatment goals for you. Your goals will be based on your age, other medical conditions you have, and how you respond to diabetes treatment. Generally, the goal of treatment is to maintain the following blood glucose levels:  Before meals (preprandial): 80-130 mg/dL (4.4-7.2 mmol/L).  After meals (postprandial): below 180 mg/dL (10 mmol/L).  A1c level: less than  7%. Supplies needed:  Blood glucose meter.  Test strips for your meter. Each meter has its own strips. You must use the strips that came with your meter.  A needle to prick your finger (lancet). Do not use a lancet more than one time.  A device that holds the lancet (lancing device).  A journal or log book to write down your results. How to check your blood glucose  1. Wash your hands with soap and water. 2. Prick the side of your finger (not the tip) with the lancet. Use a different finger each time. 3. Gently rub the finger until a small drop of blood appears. 4. Follow instructions that come with your meter for inserting the test strip, applying blood to the strip, and using your blood glucose meter. 5. Write down your result and any notes. Some meters allow you to use areas of your body other than your finger (alternative sites) to test your blood. The most common alternative sites are:  Forearm.  Thigh.  Palm of the hand. If you think you may have hypoglycemia, or if you have a history of not knowing when your blood glucose is getting low (hypoglycemia unawareness), do not use alternative sites. Use your finger instead. Alternative sites may not be as accurate as the fingers, because blood flow is slower in these areas. This means that the result you get may be delayed, and it may be different from the result that you would get from your finger. Follow these instructions  at home: Blood glucose log   Every time you check your blood glucose, write down your result. Also write down any notes about things that may be affecting your blood glucose, such as your diet and exercise for the day. This information can help you and your health care provider: ? Look for patterns in your blood glucose over time. ? Adjust your diabetes management plan as needed.  Check if your meter allows you to download your records to a computer. Most glucose meters store a record of glucose readings in the meter. If you have type 1 diabetes:  Check your blood glucose 2 or more times a day.  Also check your blood glucose: ? Before every insulin injection. ? Before and after exercise. ? Before meals. ? 2 hours after a meal. ? Occasionally between 2:00 a.m. and 3:00 a.m., as directed. ? Before potentially dangerous tasks, like driving or using heavy machinery. ? At bedtime.  You may need to check your blood glucose more often, up to 6-10 times a day, if you: ? Use an insulin pump. ? Need multiple daily injections (MDI). ? Have diabetes that is not well-controlled. ? Are ill. ? Have a history of severe hypoglycemia. ? Have hypoglycemia unawareness. If you have type 2 diabetes:  If you take insulin or other diabetes medicines, check your blood glucose 2 or more times a day.  If you are on intensive insulin therapy, check your blood glucose 4 or more times a day. Occasionally, you may also need to check between 2:00 a.m. and 3:00 a.m., as directed.  Also check your blood glucose: ? Before and after exercise. ? Before potentially dangerous tasks, like driving or using heavy machinery.  You may need to check your blood glucose more often if: ? Your medicine is being adjusted. ? Your diabetes is not well-controlled. ? You are ill. General tips  Always keep your supplies with you.  If you have questions or need help, all blood glucose  meters have a 24-hour "hotline" phone  number that you can call. You may also contact your health care provider.  After you use a few boxes of test strips, adjust (calibrate) your blood glucose meter by following instructions that came with your meter. Contact a health care provider if:  Your blood glucose is at or above 240 mg/dL (13.3 mmol/L) for 2 days in a row.  You have been sick or have had a fever for 2 days or longer, and you are not getting better.  You have any of the following problems for more than 6 hours: ? You cannot eat or drink. ? You have nausea or vomiting. ? You have diarrhea. Get help right away if:  Your blood glucose is lower than 54 mg/dL (3 mmol/L).  You become confused or you have trouble thinking clearly.  You have difficulty breathing.  You have moderate or large ketone levels in your urine. Summary  Monitoring your blood sugar (glucose) is an important part of managing your diabetes (diabetes mellitus).  Blood glucose monitoring involves checking your blood glucose as often as directed and keeping a record (log) of your results over time.  Your health care provider will set individualized treatment goals for you. Your goals will be based on your age, other medical conditions you have, and how you respond to diabetes treatment.  Every time you check your blood glucose, write down your result. Also write down any notes about things that may be affecting your blood glucose, such as your diet and exercise for the day. This information is not intended to replace advice given to you by your health care provider. Make sure you discuss any questions you have with your health care provider. Document Revised: 06/22/2018 Document Reviewed: 02/08/2016 Elsevier Patient Education  [place].   Hypoglycemia Hypoglycemia is when the sugar (glucose) level in your blood is too low. Signs of low blood sugar may include:  Feeling: ? Hungry. ? Worried or nervous (anxious). ? Sweaty and  clammy. ? Confused. ? Dizzy. ? Sleepy. ? Sick to your stomach (nauseous).  Having: ? A fast heartbeat. ? A headache. ? A change in your vision. ? Tingling or no feeling (numbness) around your mouth, lips, or tongue. ? Jerky movements that you cannot control (seizure).  Having trouble with: ? Moving (coordination). ? Sleeping. ? Passing out (fainting). ? Getting upset easily (irritability). Low blood sugar can happen to people who have diabetes and people who do not have diabetes. Low blood sugar can happen quickly, and it can be an emergency. Treating low blood sugar Low blood sugar is often treated by eating or drinking something sugary right away, such as:  Fruit juice, 4-6 oz (120-150 mL).  Regular soda (not diet soda), 4-6 oz (120-150 mL).  Low-fat milk, 4 oz (120 mL).  Several pieces of hard candy.  Sugar or honey, 1 Tbsp (15 mL). Treating low blood sugar if you have diabetes If you can think clearly and swallow safely, follow the 15:15 rule:  Take 15 grams of a fast-acting carb (carbohydrate). Talk with your doctor about how much you should take.  Always keep a source of fast-acting carb with you, such as: ? Sugar tablets (glucose pills). Take 3-4 pills. ? 6-8 pieces of hard candy. ? 4-6 oz (120-150 mL) of fruit juice. ? 4-6 oz (120-150 mL) of regular (not diet) soda. ? 1 Tbsp (15 mL) honey or sugar.  Check your blood sugar 15 minutes after you take the  carb.  If your blood sugar is still at or below 70 mg/dL (3.9 mmol/L), take 15 grams of a carb again.  If your blood sugar does not go above 70 mg/dL (3.9 mmol/L) after 3 tries, get help right away.  After your blood sugar goes back to normal, eat a meal or a snack within 1 hour.  Treating very low blood sugar If your blood sugar is at or below 54 mg/dL (3 mmol/L), you have very low blood sugar (severe hypoglycemia). This may also cause:  Passing out.  Jerky movements you cannot control  (seizure).  Losing consciousness (coma). This is an emergency. Do not wait to see if the symptoms will go away. Get medical help right away. Call your local emergency services (911 in the [place] Do not drive yourself to the hospital. If you have very low blood sugar and you cannot eat or drink, you may need a glucagon shot (injection). A family member or friend should learn how to check your blood sugar and how to give you a glucagon shot. Ask your doctor if you need to have a glucagon shot kit at home. Follow these instructions at home: General instructions  Take over-the-counter and prescription medicines only as told by your doctor.  Stay aware of your blood sugar as told by your doctor.  Limit alcohol intake to no more than 1 drink a day for nonpregnant women and 2 drinks a day for men. One drink equals 12 oz of beer (355 mL), 5 oz of wine (148 mL), or 1 oz of hard liquor (44 mL).  Keep all follow-up visits as told by your doctor. This is important. If you have diabetes:   Follow your diabetes care plan as told by your doctor. Make sure you: ? Know the signs of low blood sugar. ? Take your medicines as told. ? Follow your exercise and meal plan. ? Eat on time. Do not skip meals. ? Check your blood sugar as often as told by your doctor. Always check it before and after exercise. ? Follow your sick day plan when you cannot eat or drink normally. Make this plan ahead of time with your doctor.  Share your diabetes care plan with: ? Your work or school. ? People you live with.  Check your pee (urine) for ketones: ? When you are sick. ? As told by your doctor.  Carry a card or wear jewelry that says you have diabetes. Contact a doctor if:  You have trouble keeping your blood sugar in your target range.  You have low blood sugar often. Get help right away if:  You still have symptoms after you eat or drink something sugary.  Your blood sugar is at or below 54 mg/dL (3  mmol/L).  You have jerky movements that you cannot control.  You pass out. These symptoms may be an emergency. Do not wait to see if the symptoms will go away. Get medical help right away. Call your local emergency services (911 in the [place] Do not drive yourself to the hospital. Summary  Hypoglycemia happens when the level of sugar (glucose) in your blood is too low.  Low blood sugar can happen to people who have diabetes and people who do not have diabetes. Low blood sugar can happen quickly, and it can be an emergency.  Make sure you know the signs of low blood sugar and know how to treat it.  Always keep a source of sugar (fast-acting carb) with you to  treat low blood sugar. This information is not intended to replace advice given to you by your health care provider. Make sure you discuss any questions you have with your health care provider. Document Revised: 12/20/2018 Document Reviewed: 10/02/2015 Elsevier Patient Education  [place].  Hyperglycemia Hyperglycemia occurs when the level of sugar (glucose) in the blood is too high. Glucose is a type of sugar that provides the body's main source of energy. Certain hormones (insulin and glucagon) control the level of glucose in the blood. Insulin lowers blood glucose, and glucagon increases blood glucose. Hyperglycemia can result from having too little insulin in the bloodstream, or from the body not responding normally to insulin. Hyperglycemia occurs most often in people who have diabetes (diabetes mellitus), but it can happen in people who do not have diabetes. It can develop quickly, and it can be life-threatening if it causes you to become severely dehydrated (diabetic ketoacidosis or hyperglycemic hyperosmolar state). Severe hyperglycemia is a medical emergency. What are the causes? If you have diabetes, hyperglycemia may be caused by:  Diabetes medicine.  Medicines that increase blood glucose or affect your diabetes  control.  Not eating enough, or not eating often enough.  Changes in physical activity level.  Being sick or having an infection. If you have prediabetes or undiagnosed diabetes:  Hyperglycemia may be caused by those conditions. If you do not have diabetes, hyperglycemia may be caused by:  Certain medicines, including steroid medicines, beta-blockers, epinephrine, and thiazide diuretics.  Stress.  Serious illness.  Surgery.  Diseases of the pancreas.  Infection. What increases the risk? Hyperglycemia is more likely to develop in people who have risk factors for diabetes, such as:  Having a family member with diabetes.  Having a gene for type 1 diabetes that is passed from parent to child (inherited).  Living in an area with cold weather conditions.  Exposure to certain viruses.  Certain conditions in which the body's disease-fighting (immune) system attacks itself (autoimmune disorders).  Being overweight or obese.  Having an inactive (sedentary) lifestyle.  Having been diagnosed with insulin resistance.  Having a history of prediabetes, gestational diabetes, or polycystic ovarian syndrome (PCOS).  Being of American-Indian, African-American, Hispanic/Latino, or Asian/Pacific Islander descent. What are the signs or symptoms? Hyperglycemia may not cause any symptoms. If you do have symptoms, they may include early warning signs, such as:  Increased thirst.  Hunger.  Feeling very tired.  Needing to urinate more often than usual.  Blurry vision. Other symptoms may develop if hyperglycemia gets worse, such as:  Dry mouth.  Loss of appetite.  Fruity-smelling breath.  Weakness.  Unexpected or rapid weight gain or weight loss.  Tingling or numbness in the hands or feet.  Headache.  Skin that does not quickly return to normal after being lightly pinched and released (poor skin turgor).  Abdominal pain.  Cuts or bruises that are slow to heal. How is  this diagnosed? Hyperglycemia is diagnosed with a blood test to measure your blood glucose level. This blood test is usually done while you are having symptoms. Your health care provider may also do a physical exam and review your medical history. You may have more tests to determine the cause of your hyperglycemia, such as:  A fasting blood glucose (FBG) test. You will not be allowed to eat (you will fast) for at least 8 hours before a blood sample is taken.  An A1c (hemoglobin A1c) blood test. This provides information about blood glucose control over  the previous 2-3 months.  An oral glucose tolerance test (OGTT). This measures your blood glucose at two times: ? After fasting. This is your baseline blood glucose level. ? Two hours after drinking a beverage that contains glucose. How is this treated? Treatment depends on the cause of your hyperglycemia. Treatment may include:  Taking medicine to regulate your blood glucose levels. If you take insulin or other diabetes medicines, your medicine or dosage may be adjusted.  Lifestyle changes, such as exercising more, eating healthier foods, or losing weight.  Treating an illness or infection, if this caused your hyperglycemia.  Checking your blood glucose more often.  Stopping or reducing steroid medicines, if these caused your hyperglycemia. If your hyperglycemia becomes severe and it results in hyperglycemic hyperosmolar state, you must be hospitalized and given IV fluids. Follow these instructions at home:  General instructions  Take over-the-counter and prescription medicines only as told by your health care provider.  Do not use any products that contain nicotine or tobacco, such as cigarettes and e-cigarettes. If you need help quitting, ask your health care provider.  Limit alcohol intake to no more than 1 drink per day for nonpregnant women and 2 drinks per day for men. One drink equals 12 oz of beer, 5 oz of wine, or 1 oz of  hard liquor.  Learn to manage stress. If you need help with this, ask your health care provider.  Keep all follow-up visits as told by your health care provider. This is important. Eating and drinking   Maintain a healthy weight.  Exercise regularly, as directed by your health care provider.  Stay hydrated, especially when you exercise, get sick, or spend time in hot temperatures.  Eat healthy foods, such as: ? Lean proteins. ? Complex carbohydrates. ? Fresh fruits and vegetables. ? Low-fat dairy products. ? Healthy fats.  Drink enough fluid to keep your urine clear or pale yellow. If you have diabetes:  Make sure you know the symptoms of hyperglycemia.  Follow your diabetes management plan, as told by your health care provider. Make sure you: ? Take your insulin and medicines as directed. ? Follow your exercise plan. ? Follow your meal plan. Eat on time, and do not skip meals. ? Check your blood glucose as often as directed. Make sure to check your blood glucose before and after exercise. If you exercise longer or in a different way than usual, check your blood glucose more often. ? Follow your sick day plan whenever you cannot eat or drink normally. Make this plan in advance with your health care provider.  Share your diabetes management plan with people in your workplace, school, and household.  Check your urine for ketones when you are ill and as told by your health care provider.  Carry a medical alert card or wear medical alert jewelry. Contact a health care provider if:  Your blood glucose is at or above 240 mg/dL (13.3 mmol/L) for 2 days in a row.  You have problems keeping your blood glucose in your target range.  You have frequent episodes of hyperglycemia. Get help right away if:  You have difficulty breathing.  You have a change in how you think, feel, or act (mental status).  You have nausea or vomiting that does not go away. These symptoms may  represent a serious problem that is an emergency. Do not wait to see if the symptoms will go away. Get medical help right away. Call your local emergency services (911 in  the [place] Do not drive yourself to the hospital. Summary  Hyperglycemia occurs when the level of sugar (glucose) in the blood is too high.  Hyperglycemia is diagnosed with a blood test to measure your blood glucose level. This blood test is usually done while you are having symptoms. Your health care provider may also do a physical exam and review your medical history.  If you have diabetes, follow your diabetes management plan as told by your health care provider.  Contact your health care provider if you have problems keeping your blood glucose in your target range. This information is not intended to replace advice given to you by your health care provider. Make sure you discuss any questions you have with your health care provider. Document Revised: 05/16/2016 Document Reviewed: 05/16/2016 Elsevier Patient Education  [place].  Hemoglobin A1c Test Why am I having this test? You may have the hemoglobin A1c test (HbA1c test) done to:  Evaluate your risk for developing diabetes (diabetes mellitus).  Diagnose diabetes.  Monitor long-term control of blood sugar (glucose) in people who have diabetes and help make treatment decisions. This test may be done with other blood glucose tests, such as fasting blood glucose and oral glucose tolerance tests. What is being tested? Hemoglobin is a type of protein in the blood that carries oxygen. Glucose attaches to hemoglobin to form glycated hemoglobin. This test checks the amount of glycated hemoglobin in your blood, which is a good indicator of the average amount of glucose in your blood during the past 2-3 months. What kind of sample is taken?  A blood sample is required for this test. It is usually collected by inserting a needle into a blood vessel. Tell a health  care provider about:  All medicines you are taking, including vitamins, herbs, eye drops, creams, and over-the-counter medicines.  Any blood disorders you have.  Any surgeries you have had.  Any medical conditions you have.  Whether you are pregnant or may be pregnant. How are the results reported? Your results will be reported as a percentage that indicates how much of your hemoglobin has glucose attached to it (is glycated). Your health care provider will compare your results to normal ranges that were established after testing a large group of people (reference ranges). Reference ranges may vary among labs and hospitals. For this test, common reference ranges are:  Adult or child without diabetes: 4-5.6%.  Adult or child with diabetes and good blood glucose control: less than 7%. What do the results mean? If you have diabetes:  A result of less than 7% is considered normal, meaning that your blood glucose is well controlled.  A result higher than 7% means that your blood glucose is not well controlled, and your treatment plan may need to be adjusted. If you do not have diabetes:  A result within the reference range is considered normal, meaning that you are not at high risk for diabetes.  A result of 5.7-6.4% means that you have a high risk of developing diabetes, and you may have prediabetes. Prediabetes is the condition of having a blood glucose level that is higher than it should be, but not high enough for you to be diagnosed with diabetes. Having prediabetes puts you at risk for developing type 2 diabetes (type 2 diabetes mellitus). You may have more tests, including a repeat HbA1c test.  Results of 6.5% or higher on two separate HbA1c tests mean that you have diabetes. You may have more tests  to confirm the diagnosis. Abnormally low HbA1c values may be caused by:  Pregnancy.  Severe blood loss.  Receiving donated blood (transfusions).  Low red blood cell count  (anemia).  Long-term kidney failure.  Some unusual forms (variants) of hemoglobin. Talk with your health care provider about what your results mean. Questions to ask your health care provider Ask your health care provider, or the department that is doing the test:  When will my results be ready?  How will I get my results?  What are my treatment options?  What other tests do I need?  What are my next steps? Summary  The hemoglobin A1c test (HbA1c test) may be done to evaluate your risk for developing diabetes, to diagnose diabetes, and to monitor long-term control of blood sugar (glucose) in people who have diabetes and help make treatment decisions.  Hemoglobin is a type of protein in the blood that carries oxygen. Glucose attaches to hemoglobin to form glycated hemoglobin. This test checks the amount of glycated hemoglobin in your blood, which is a good indicator of the average amount of glucose in your blood during the past 2-3 months.  Talk with your health care provider about what your results mean. This information is not intended to replace advice given to you by your health care provider. Make sure you discuss any questions you have with your health care provider. Document Revised: 08/11/2017 Document Reviewed: 04/11/2017 Elsevier Patient Education  [place]. 21-25 1  26-35 2  36-40 2  41-50 3  51-55 3  56-65 4  66-70 4  71-80 5    Copyright 2020  Academy of Nutrition and Dietetics. All rights reserved. [facility] [phone]) 1. Dr. Philemon Kingdom 2. Dr. Janie [facility] [phone]) 1. Dr. Delrae Rend [facility] [phone]) 1. Dr. Jacelyn Pi 2. Dr. Anda Kraft [facility] [phone]) 1. Dr. Daneil Dolin [facility] [phone]) [[place] office]  [phone]) [[place] office] 1. Dr. Lenna Sciara [place] 2. Dr. Mee Hives [facility]) [phone]) 1. Autumn Hudnall Ronnald Ramp), PA 2. Dr. Amalia Greenhouse 3. Dr. Marsh Dolly. [facility] [phone]) 1. Dr. Glade Lloyd [facility]  [phone]) 1. Dr. Orville Govern 2. Dr. Lelon Huh 3. Dr. Jerelene Redden 4. Alwyn Ren, FNP Dr. Carolynn Serve. [facility] [place] [phone])    Subarachnoid Hemorrhage  Subarachnoid hemorrhage is bleeding between the brain and the layer that covers the brain. The bleeding puts pressure on the brain, and it stops blood from going to some areas of the brain. If this bleeding is not treated, it may cause brain damage, stroke, or death. This is an emergency. You must be treated in the hospital right away. You are more likely to get this condition if you:  Smoke.  Have high blood pressure.  Drink too much alcohol.  Are older than age 76.  Are female, especially if you have stopped getting your period for a year or longer (menopause).  Have a family history of burst blood vessels (aneurysms).  Have a certain syndrome that leads to one of these: ? Kidney disease. ? Disease of tissues like bones, blood, and fat (connective tissues). Signs of this bleeding condition include:  Sudden, very bad headache. It may feel like the worst headache you have ever had.  Feeling sick to your stomach (nausea) or throwing up (vomiting), especially if you have other signs such as a headache.  Sudden weakness  or loss of feeling (numbness) in your face, arm, or leg, especially on one side of the body.  Sudden trouble with any of these: ? Walking. ? Moving an arm or leg. ? Talking. ? Understanding what people say. ? Swallowing. ? Seeing out of one eye or both eyes.  Sudden confusion.  Seeing double.  Loss of balance.  Sensitivity to light.  Stiff neck.  Trouble staying awake.  Passing out (fainting). Follow  these instructions at home: Medicines  Take over-the-counter and prescription medicines only as told by your doctor.  Do not take any medicines that contain aspirin or NSAIDs (like ibuprofen) unless your doctor says that it is safe to take them. Lifestyle  Do not use any products that have nicotine or tobacco. These include cigarettes and e-cigarettes. If you need help quitting, ask your doctor.  Limit alcohol to 1 drink a day for nonpregnant women and 2 drinks a day for men. One drink is equal to: ? 12 oz of beer. ? 5 oz of wine. ? 1 oz of hard liquor. Eating and drinking  Ask your doctor if it is safe for you to eat and drink. You may need tests to make sure that you can swallow safely (swallow studies). Driving  Do not drive until your doctor says that it is safe to drive.  Do not drive or use heavy machinery while taking prescription pain medicine. General instructions  Do therapy as recommended. This may include: ? Physical therapy (PT). ? Occupational therapy (OT). ? Speech-language therapy.  Rest and limit activity as told by your doctor. Rest helps your brain to heal. Make sure you: ? Get plenty of sleep. ? Avoid activities that cause stress to your body or mind.  Check your blood pressure as told by your doctor. Write down your blood pressure.  Keep all follow-up visits as told by your doctors. This is important. Contact a doctor if:  You have a stiff neck.  You have a cough.  You have a fever. Get help right away if:  You have any signs of a stroke. "BE FAST" is an easy way to remember the main warning signs: ? B - Balance. Signs are dizziness, sudden trouble walking, or loss of balance. ? E - Eyes. Signs are trouble seeing or a sudden change in how you see. ? F - Face. Signs are sudden weakness or loss of feeling of the face, or the face or eyelid drooping on one side. ? A - Arms. Signs are weakness or loss of feeling in an arm. This happens suddenly and  usually on one side of the body. ? S - Speech. Signs are sudden trouble speaking, slurred speech, or trouble understanding what people say. ? T - Time. Time to call emergency services. Write down what time symptoms started.  You have other signs of a stroke, such as: ? A sudden, very bad headache with no known cause. ? Feeling sick to your stomach. ? Throwing up. ? Jerky movements you cannot control (seizure). These symptoms may be an emergency. Do not wait to see if the symptoms will go away. Get medical help right away. Call your local emergency services (911 in the [place] Do not drive yourself to the hospital. Summary  Subarachnoid hemorrhage is bleeding in the brain. It is an emergency. You must be treated in the hospital right away.  Follow instructions from your doctor about eating, resting, and taking medicines.  Do not take any medicines that contain aspirin  or NSAIDs (like ibuprofen) unless your doctor says that it is safe to take them. This information is not intended to replace advice given to you by your health care provider. Make sure you discuss any questions you have with your health care provider. Document Revised: 08/11/2017 Document Reviewed: 06/08/2017 Elsevier Patient Education  2020 Elsevier Inc.    Incision and Drainage, Care After This sheet gives you information about how to care for yourself after your procedure. Your health care provider may also give you more specific instructions. If you have problems or questions, contact your health care provider. What can I expect after the procedure? After the procedure, it is common to have:  Pain or discomfort around the incision site.  Blood, fluid, or pus (drainage) from the incision.  Redness and firm skin around the incision site. Follow these instructions at home: Medicines  Take over-the-counter and prescription medicines only as told by your health care provider.  If you were prescribed an antibiotic  medicine, use or take it as told by your health care provider. Do not stop using the antibiotic even if you start to feel better. Wound care Follow instructions from your health care provider about how to take care of your wound. Make sure you:  Wash your hands with soap and water before and after you change your bandage (dressing). If soap and water are not available, use hand sanitizer.  Change your dressing and packing as told by your health care provider. ? If your dressing is dry or stuck when you try to remove it, moisten or wet the dressing with saline or water so that it can be removed without harming your skin or tissues. ? If your wound is packed, leave it in place until your health care provider tells you to remove it. To remove the packing, moisten or wet the packing with saline or water so that it can be removed without harming your skin or tissues.  Leave stitches (sutures), skin glue, or adhesive strips in place. These skin closures may need to stay in place for 2 weeks or longer. If adhesive strip edges start to loosen and curl up, you may trim the loose edges. Do not remove adhesive strips completely unless your health care provider tells you to do that. Check your wound every day for signs of infection. Check for:  More redness, swelling, or pain.  More fluid or blood.  Warmth.  Pus or a bad smell. If you were sent home with a drain tube in place, follow instructions from your health care provider about:  How to empty it.  How to care for it at home.  General instructions  Rest the affected area.  Do not take baths, swim, or use a hot tub until your health care provider approves. Ask your health care provider if you may take showers. You may only be allowed to take sponge baths.  Return to your normal activities as told by your health care provider. Ask your health care provider what activities are safe for you. Your health care provider may put you on activity or  lifting restrictions.  The incision will continue to drain. It is normal to have some clear or slightly bloody drainage. The amount of drainage should lessen each day.  Do not apply any creams, ointments, or liquids unless you have been told to by your health care provider.  Keep all follow-up visits as told by your health care provider. This is important. Contact a health care provider  if:  Your cyst or abscess returns.  You have a fever or chills.  You have more redness, swelling, or pain around your incision.  You have more fluid or blood coming from your incision.  Your incision feels warm to the touch.  You have pus or a bad smell coming from your incision.  You have red streaks above or below the incision site. Get help right away if:  You have severe pain or bleeding.  You cannot eat or drink without vomiting.  You have decreased urine output.  You become short of breath.  You have chest pain.  You cough up blood.  The affected area becomes numb or starts to tingle. These symptoms may represent a serious problem that is an emergency. Do not wait to see if the symptoms will go away. Get medical help right away. Call your local emergency services (911 in the [place] Do not drive yourself to the hospital. Summary  After this procedure, it is common to have fluid, blood, or pus coming from the surgery site.  Follow all home care instructions. You will be told how to take care of your incision, how to check for infection, and how to take medicines.  If you were prescribed an antibiotic medicine, take it as told by your health care provider. Do not stop taking the antibiotic even if you start to feel better.  Contact a health care provider if you have increased redness, swelling, or pain around your incision. Get help right away if you have chest pain, you vomit, you cough up blood, or you have shortness of breath.  Keep all follow-up visits as told by your health care  provider. This is important. This information is not intended to replace advice given to you by your health care provider. Make sure you discuss any questions you have with your health care provider. Document Revised: 07/30/2018 Document Reviewed: 07/30/2018 Elsevier Patient Education  2020 [employer].

## 2020-07-29 NOTE — TOC Transition Note (Addendum)
Transition of Care (TOC) - CM/SW Discharge Note   Patient Details  Name: Jasmine Short MRN: [id number] Date of Birth: 05/07/1986  Transition of Care (TOC) CM/SW Contact:  Amerson, Julie M, RN Phone Number: 07/29/2020, 3:01 PM   Clinical Narrative: Pt medically stable for discharge home today with family.  Met with pt with PA present; she is agreeable now to setting up PCP follow up appointment at [facility], in case she is not able to secure a PCP of her own with her new insurance.  Pt is uninsured, but is eligible for medication assistance through [facility] MATCH program. DC Rx will be sent to TOC pharmacy, to be filled using MATCH letter.  Pt appreciative of assistance.  Speech therapy recommending OP f/u; referral made to [facility] main campus rehab for follow up.  Rehab center will call pt for follow up appointment. Pt declines need for tub bench.      Final next level of care: OP Rehab Barriers to Discharge: Barriers Resolved   Patient Goals and CMS Choice Patient states their goals for this hospitalization and ongoing recovery are:: return home                             Discharge Plan and Services   Discharge Planning Services: Follow-up appt scheduled, Indigent Health Clinic, MATCH Program, Medication Assistance                                 Social Determinants of Health (SDOH) Interventions     Readmission Risk Interventions Readmission Risk Prevention Plan 07/29/2020  Post Dischage Appt Complete  Medication Screening Complete  Transportation Screening Complete   Julie W. Amerson, RN, BSN  Trauma/Neuro ICU Case Manager [phone]   
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
## 2020-08-02 LAB — AEROBIC CULTURE W GRAM STAIN (SUPERFICIAL SPECIMEN): Gram Stain: NONE SEEN

## 2020-08-03 ENCOUNTER — Inpatient Hospital Stay: Payer: Self-pay | Admitting: Family Medicine

## 2020-08-05 LAB — GLUCOSE, CAPILLARY
Glucose-Capillary: 136 mg/dL — ABNORMAL HIGH (ref 70–99)
Glucose-Capillary: 143 mg/dL — ABNORMAL HIGH (ref 70–99)
Glucose-Capillary: 148 mg/dL — ABNORMAL HIGH (ref 70–99)
Glucose-Capillary: 151 mg/dL — ABNORMAL HIGH (ref 70–99)
Glucose-Capillary: 164 mg/dL — ABNORMAL HIGH (ref 70–99)
Glucose-Capillary: 166 mg/dL — ABNORMAL HIGH (ref 70–99)
Glucose-Capillary: 169 mg/dL — ABNORMAL HIGH (ref 70–99)
Glucose-Capillary: 174 mg/dL — ABNORMAL HIGH (ref 70–99)
Glucose-Capillary: 175 mg/dL — ABNORMAL HIGH (ref 70–99)
Glucose-Capillary: 182 mg/dL — ABNORMAL HIGH (ref 70–99)
Glucose-Capillary: 190 mg/dL — ABNORMAL HIGH (ref 70–99)
Glucose-Capillary: 191 mg/dL — ABNORMAL HIGH (ref 70–99)
Glucose-Capillary: 199 mg/dL — ABNORMAL HIGH (ref 70–99)
Glucose-Capillary: 208 mg/dL — ABNORMAL HIGH (ref 70–99)
Glucose-Capillary: 213 mg/dL — ABNORMAL HIGH (ref 70–99)
Glucose-Capillary: 239 mg/dL — ABNORMAL HIGH (ref 70–99)
Glucose-Capillary: 247 mg/dL — ABNORMAL HIGH (ref 70–99)

## 2020-08-11 ENCOUNTER — Encounter: Payer: Self-pay | Attending: Family Medicine | Admitting: *Deleted

## 2020-08-11 ENCOUNTER — Other Ambulatory Visit: Payer: Self-pay

## 2020-08-11 ENCOUNTER — Encounter: Payer: Self-pay | Admitting: *Deleted

## 2020-08-11 VITALS — BP 126/80 | Ht 69.0 in | Wt 258.3 lb

## 2020-08-11 DIAGNOSIS — E119 Type 2 diabetes mellitus without complications: Secondary | ICD-10-CM

## 2020-08-11 DIAGNOSIS — R739 Hyperglycemia, unspecified: Secondary | ICD-10-CM | POA: Diagnosis present

## 2020-08-11 NOTE — Patient Instructions (Signed)
Check blood sugars 4 x day before each meal and before bed every day Bring blood sugar records to the next MD appointment  Exercise: Continue walking for  90  minutes  5-7  days a week  Eat 3 meals day, 1-2  snacks a day Space meals 4-6 hours apart  Carry fast acting glucose and a snack at all times Rotate injection sites  Call back if you want to schedule Diabetes classes

## 2020-08-12 NOTE — Progress Notes (Signed)
Diabetes Self-Management Education  Visit Type: First/Initial  Appt. Start Time: 1605 Appt. End Time: 1655  08/11/2020  [name], identified by name and date of birth, is a 34 y.o. female with a diagnosis of Diabetes: Type 2.   ASSESSMENT  Blood pressure 126/80, height 5\' 9"  (1.753 m), weight 258 lb 4.8 oz (117.2 kg), last menstrual period 07/16/2020. Body mass index is 38.14 kg/m.   Diabetes Self-Management Education - 08/11/20 1700      Visit Information   Visit Type First/Initial      Initial Visit   Diabetes Type Type 2    Are you currently following a meal plan? Yes    What type of meal plan do you follow? "eating less carbs and more protein"    Are you taking your medications as prescribed? Yes    Date Diagnosed 3 weeks ago      Health Coping   How would you rate your overall health? Fair      Psychosocial Assessment   Patient Belief/Attitude about Diabetes Motivated to manage diabetes    Self-care barriers None    Self-management support Doctor's office;Friends    Patient Concerns Nutrition/Meal planning;Weight Control;Healthy Lifestyle    Special Needs None    Preferred Learning Style Visual    Learning Readiness Change in progress    How often do you need to have someone help you when you read instructions, pamphlets, or other written materials from your doctor or pharmacy? 1 - Never    What is the last grade level you completed in school? GED      Pre-Education Assessment   Patient understands the diabetes disease and treatment process. Needs Instruction    Patient understands incorporating nutritional management into lifestyle. Needs Instruction    Patient undertands incorporating physical activity into lifestyle. Needs Instruction    Patient understands using medications safely. Needs Instruction    Patient understands monitoring blood glucose, interpreting and using results Needs Review    Patient understands prevention, detection, and treatment  of acute complications. Needs Instruction    Patient understands prevention, detection, and treatment of chronic complications. Needs Instruction    Patient understands how to develop strategies to address psychosocial issues. Needs Instruction    Patient understands how to develop strategies to promote health/change behavior. Needs Instruction      Complications   Last HgB A1C per patient/outside source 9.7 %   07/23/2020   How often do you check your blood sugar? 3-4 times/day    Fasting Blood glucose range (mg/dL) 13/07/2020   Pt reports FBG's of 130 mg/dL this morning and all pre-meal readings 135-180 mg/dL.   Postprandial Blood glucose range (mg/dL) --   Pt reports bedtime readings 135-180 mg/dL with reading of 203-559 mg/dL last night.   Have you had a dilated eye exam in the past 12 months? Yes    Have you had a dental exam in the past 12 months? No    Are you checking your feet? Yes    How many days per week are you checking your feet? 5      Dietary Intake   Breakfast egg sandwich, peanut butter and jelly sandwich, peanut butter crackers, bagel with egg    Lunch 1 snack/day - tuna or chicken on salad wtih lettuce, tomatoes, egg, onions; grilled chicken nuggets or chicken sandwich; chicken pot pie with vegetables    Snack (afternoon) 1 snack/day - peantu butter crackers, fruit (orange, apple, banana, grapes, blueberries), broccoli  Dinner chicken, fish, some beef; potatoes, peas, beans, corn, rice, noodles, broccoli, asparagus, squash, zucchini    Beverage(s) water, diet soda, sugar free drinks - Crystal lite      Exercise   Exercise Type Light (walking / raking leaves)    How many days per week to you exercise? 6    How many minutes per day do you exercise? 90    Total minutes per week of exercise 540      Patient Education   Previous Diabetes Education No    Disease state  Definition of diabetes, type 1 and 2, and the diagnosis of diabetes;Factors that contribute to the development  of diabetes    Nutrition management  Role of diet in the treatment of diabetes and the relationship between the three main macronutrients and blood glucose level;Food label reading, portion sizes and measuring food.;Reviewed blood glucose goals for pre and post meals and how to evaluate the patients' food intake on their blood glucose level.;Meal timing in regards to the patients' current diabetes medication.    Physical activity and exercise  Role of exercise on diabetes management, blood pressure control and cardiac health.    Medications Taught/reviewed insulin injection, site rotation, insulin storage and needle disposal.;Reviewed patients medication for diabetes, action, purpose, timing of dose and side effects.    Monitoring Purpose and frequency of SMBG.;Taught/discussed recording of test results and interpretation of SMBG.;Identified appropriate SMBG and/or A1C goals.    Acute complications Taught treatment of hypoglycemia - the 15 rule.    Chronic complications Relationship between chronic complications and blood glucose control    Psychosocial adjustment Identified and addressed patients feelings and concerns about diabetes    Personal strategies to promote health Review risk of smoking and offered smoking cessation      Individualized Goals (developed by patient)   Reducing Risk Other (comment)   lose weight, lead a healthier lifestyle, quit smoking, become more fit     Outcomes   Expected Outcomes Other (comment)   Distracted during visit, but reports motivated to change   Program Status Not Completed           Individualized Plan for Diabetes Self-Management Training:   Learning Objective:  Patient will have a greater understanding of diabetes self-management. Patient education plan is to attend individual and/or group sessions per assessed needs and concerns.   Plan:   Patient Instructions  Check blood sugars 4 x day before each meal and before bed every day Bring blood  sugar records to the next MD appointment  Exercise: Continue walking for  90  minutes  5-7  days a week  Eat 3 meals day, 1-2  snacks a day Space meals 4-6 hours apart  Carry fast acting glucose and a snack at all times Rotate injection sites  Call back if you want to schedule Diabetes classes   Expected Outcomes:  Distracted during visit, but reports motivated to change  Education material provided:  General Meal Planning Guidelines Simple Meal Plan Glucose tablets Symptoms, causes and treatments of Hypoglycemia  If problems or questions, patient to contact team via:  Sharion Settler, RN, CCM, CDCES [phone]  Future DSME appointment:  PRN

## 2020-08-28 ENCOUNTER — Telehealth: Payer: Self-pay

## 2020-08-28 MED FILL — LANTUS SOLOSTAR 100 UNITS/M: 100 | 33 days supply | Qty: 6 | Fill #0

## 2020-08-28 MED FILL — NOVOLOG FLEXPEN SYRINGE: 100 | 30 days supply | Qty: 15 | Fill #0

## 2020-08-28 NOTE — Telephone Encounter (Signed)
This is a hospital discharge patient and she was given refills on her Novolog and Lanuts insulins.  If appropriate, can you resend the refills to [facility] for Humalog and Mariella Saa so we can dispense these to her at no charge, she qualifies for our free stock for Humalog and Basaglar.

## 2020-10-07 MED FILL — LANTUS SOLOSTAR 100 UNITS/M: 100 | 33 days supply | Qty: 6 | Fill #1

## 2020-10-07 MED FILL — NOVOLOG FLEXPEN SYRINGE: 100 | 30 days supply | Qty: 15 | Fill #1

## 2020-10-10 ENCOUNTER — Emergency Department
Admission: EM | Admit: 2020-10-10 | Discharge: 2020-10-10 | Disposition: A | Discharge status: Home / Self Care | Payer: Self-pay | Attending: Emergency Medicine | Admitting: Emergency Medicine

## 2020-10-10 ENCOUNTER — Emergency Department: Payer: Self-pay

## 2020-10-10 DIAGNOSIS — Z794 Long term (current) use of insulin: Secondary | ICD-10-CM | POA: Insufficient documentation

## 2020-10-10 DIAGNOSIS — S62667A Nondisplaced fracture of distal phalanx of left little finger, initial encounter for closed fracture: Secondary | ICD-10-CM | POA: Insufficient documentation

## 2020-10-10 DIAGNOSIS — E119 Type 2 diabetes mellitus without complications: Secondary | ICD-10-CM | POA: Insufficient documentation

## 2020-10-10 DIAGNOSIS — F17210 Nicotine dependence, cigarettes, uncomplicated: Secondary | ICD-10-CM | POA: Insufficient documentation

## 2020-10-10 DIAGNOSIS — W009XXA Unspecified fall due to ice and snow, initial encounter: Secondary | ICD-10-CM | POA: Insufficient documentation

## 2020-10-10 DIAGNOSIS — Z79899 Other long term (current) drug therapy: Secondary | ICD-10-CM | POA: Insufficient documentation

## 2020-10-10 DIAGNOSIS — I10 Essential (primary) hypertension: Secondary | ICD-10-CM | POA: Insufficient documentation
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
## 2020-10-10 NOTE — ED Triage Notes (Signed)
Pt slipped on ice this am and c/o pain to left pinky finger.

## 2020-10-10 NOTE — ED Notes (Signed)
Patient was given graham crackers, peanut butter and ginger ale per her request for "feeling like my sugar is low."

## 2020-11-06 ENCOUNTER — Other Ambulatory Visit: Payer: Self-pay

## 2020-11-06 ENCOUNTER — Emergency Department: Payer: Self-pay

## 2020-11-06 ENCOUNTER — Other Ambulatory Visit: Payer: Self-pay | Admitting: Physician Assistant

## 2020-11-06 ENCOUNTER — Emergency Department
Admission: EM | Admit: 2020-11-06 | Discharge: 2020-11-06 | Disposition: A | Discharge status: Home / Self Care | Payer: Self-pay | Attending: Emergency Medicine | Admitting: Emergency Medicine

## 2020-11-06 DIAGNOSIS — Z794 Long term (current) use of insulin: Secondary | ICD-10-CM | POA: Insufficient documentation

## 2020-11-06 DIAGNOSIS — F17210 Nicotine dependence, cigarettes, uncomplicated: Secondary | ICD-10-CM | POA: Insufficient documentation

## 2020-11-06 DIAGNOSIS — X501XXA Overexertion from prolonged static or awkward postures, initial encounter: Secondary | ICD-10-CM | POA: Insufficient documentation

## 2020-11-06 DIAGNOSIS — E119 Type 2 diabetes mellitus without complications: Secondary | ICD-10-CM | POA: Insufficient documentation

## 2020-11-06 DIAGNOSIS — S8391XA Sprain of unspecified site of right knee, initial encounter: Secondary | ICD-10-CM | POA: Insufficient documentation

## 2020-11-06 DIAGNOSIS — I10 Essential (primary) hypertension: Secondary | ICD-10-CM | POA: Insufficient documentation

## 2020-11-06 DIAGNOSIS — Y990 Civilian activity done for income or pay: Secondary | ICD-10-CM | POA: Insufficient documentation

## 2020-11-06 DIAGNOSIS — Z79899 Other long term (current) drug therapy: Secondary | ICD-10-CM | POA: Insufficient documentation

## 2020-11-06 MED ORDER — AMLODIPINE BESYLATE 10 MG PO TABS: 10.0000 mg | ORAL_TABLET | Freq: Every day | ORAL | 3 refills | Status: DC

## 2020-11-06 MED ORDER — INSULIN GLARGINE 100 UNIT/ML SOLOSTAR PEN: 18.0000 [IU] | PEN_INJECTOR | Freq: Every day | SUBCUTANEOUS | 1 refills | Status: AC

## 2020-11-06 MED ORDER — CYCLOBENZAPRINE HCL 5 MG PO TABS: 5.0000 mg | ORAL_TABLET | Freq: Three times a day (TID) | ORAL | 0 refills | Status: DC | PRN

## 2020-11-06 MED ORDER — INSULIN PEN NEEDLE 32G X 4 MM MISC: 90.0000 | Freq: Three times a day (TID) | 1 refills | Status: DC

## 2020-11-06 MED ORDER — INSULIN ASPART 100 UNIT/ML ~~LOC~~ SOLN: 15.0000 [IU] | Freq: Three times a day (TID) | SUBCUTANEOUS | 1 refills | Status: AC

## 2020-11-06 NOTE — Discharge Instructions (Addendum)
Your x-ray is negative at this time.  No signs of any fracture or dislocation.  Your symptoms are consistent with a knee sprain and a small amount of fluid around the joint.  With Ace bandage as needed.  Apply ice to reduce swelling.  Rest with the knee elevated when seated.  Take over-the-counter Tylenol and muscle relaxant as directed.  Follow-up with one of the local community clinics for ongoing symptom management.

## 2020-11-06 NOTE — ED Triage Notes (Signed)
R knee pain x1 week with worsening today. Denies known fall or injury but reports stands on feet all day at work for 8+ hours. Swelling noted. Denies hx of gout or arthritis. No swelling noted to calf or foot. No obvious injury or deformity. Pt reports pain feels as if knee is surrounded by fluid.
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
## 2020-12-15 ENCOUNTER — Ambulatory Visit
Admission: EM | Admit: 2020-12-15 | Discharge: 2020-12-15 | Disposition: A | Discharge status: Home / Self Care | Payer: BC Managed Care – PPO | Attending: Sports Medicine | Admitting: Sports Medicine

## 2020-12-15 ENCOUNTER — Other Ambulatory Visit: Payer: Self-pay

## 2020-12-15 ENCOUNTER — Encounter: Payer: Self-pay | Admitting: Emergency Medicine

## 2020-12-15 ENCOUNTER — Telehealth: Payer: Self-pay

## 2020-12-15 DIAGNOSIS — N6310 Unspecified lump in the right breast, unspecified quadrant: Secondary | ICD-10-CM | POA: Diagnosis not present

## 2020-12-15 DIAGNOSIS — N644 Mastodynia: Secondary | ICD-10-CM | POA: Diagnosis not present

## 2020-12-15 NOTE — ED Triage Notes (Signed)
Pt c/o right breast pain. Started about 3-4 days ago. She states she can feel a lump in the area.

## 2020-12-15 NOTE — Discharge Instructions (Addendum)
We are arranging for you to have an outpatient breast ultrasound and mammogram.  Someone will contact you with the information about your appointment. I placed a referral in for you to see OB/GYN. I also placed a referral for you to establish care with a primary care provider.  I would encourage you to look on the back of your insurance card and call the number or check online for providers locally that are taking new patients.  You do need somebody to monitor your insulin needs for your type 2 diabetes. Please see educational handout provided.  If you are having any problems please call back to the clinic.  I hope you get to feeling better, Dr. Zachery Dauer

## 2020-12-15 NOTE — Telephone Encounter (Signed)
Tried reaching out to patient with phone number provided, but could not leave message to set up appointment for PCP.

## 2020-12-15 NOTE — Telephone Encounter (Signed)
-----   Message from Aaron Edelman, RN sent at 12/15/2020  2:12 PM EDT ----- Regarding: [facility] to PCP Needs to establish with PCP - routine

## 2020-12-16 ENCOUNTER — Ambulatory Visit (INDEPENDENT_AMBULATORY_CARE_PROVIDER_SITE_OTHER): Payer: BC Managed Care – PPO | Admitting: Advanced Practice Midwife

## 2020-12-16 ENCOUNTER — Encounter: Payer: Self-pay | Admitting: Advanced Practice Midwife

## 2020-12-16 VITALS — BP 135/89 | Ht 69.0 in | Wt 265.4 lb

## 2020-12-16 DIAGNOSIS — Z09 Encounter for follow-up examination after completed treatment for conditions other than malignant neoplasm: Secondary | ICD-10-CM | POA: Diagnosis not present

## 2020-12-17 ENCOUNTER — Other Ambulatory Visit: Payer: Self-pay

## 2020-12-17 ENCOUNTER — Other Ambulatory Visit: Payer: Self-pay | Admitting: Physician Assistant

## 2020-12-17 ENCOUNTER — Encounter: Payer: Self-pay | Admitting: Advanced Practice Midwife

## 2020-12-17 MED FILL — Insulin Pen Needle 32 G X 4 MM (1/6" or 5/32"): 30 days supply | Qty: 100 | Fill #0 | Status: CN

## 2020-12-17 MED FILL — Insulin Glargine Soln Pen-Injector 100 Unit/ML: SUBCUTANEOUS | 33 days supply | Qty: 6 | Fill #0 | Status: AC
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
## 2020-12-18 ENCOUNTER — Other Ambulatory Visit: Payer: Self-pay

## 2020-12-18 ENCOUNTER — Ambulatory Visit
Admission: RE | Admit: 2020-12-18 | Discharge: 2020-12-18 | Disposition: A | Discharge status: Home / Self Care | Payer: BC Managed Care – PPO | Source: Ambulatory Visit | Attending: Sports Medicine | Admitting: Sports Medicine

## 2020-12-18 DIAGNOSIS — N641 Fat necrosis of breast: Secondary | ICD-10-CM | POA: Insufficient documentation

## 2020-12-18 DIAGNOSIS — Y939 Activity, unspecified: Secondary | ICD-10-CM | POA: Diagnosis not present

## 2020-12-18 DIAGNOSIS — N644 Mastodynia: Secondary | ICD-10-CM | POA: Insufficient documentation

## 2020-12-18 DIAGNOSIS — Y929 Unspecified place or not applicable: Secondary | ICD-10-CM | POA: Insufficient documentation

## 2020-12-18 DIAGNOSIS — S2001XD Contusion of right breast, subsequent encounter: Secondary | ICD-10-CM | POA: Insufficient documentation
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
## 2020-12-21 ENCOUNTER — Other Ambulatory Visit: Payer: Self-pay | Admitting: Sports Medicine

## 2021-01-01 ENCOUNTER — Other Ambulatory Visit: Payer: Self-pay

## 2021-03-04 ENCOUNTER — Telehealth: Payer: Self-pay | Admitting: Pharmacy Technician

## 2021-03-04 NOTE — Telephone Encounter (Signed)
Patient failed to provide 2022 proof of income.  No additional medication assistance will be provided by [facility] without the required proof of income documentation.  Patient notified by letter.  Sherilyn Dacosta Care Manager [facility]    [address] [place], [place]  [address]  March 04, 2021    Jasmine Short [address] [place], [place]  [address]  Dear [name]:  This is to inform you that you are no longer eligible to receive medication assistance at [facility].  The reason(s) are:    _____Your total gross monthly household income exceeds 250% of the Federal Poverty Level.   _____Tangible assets (savings, checking, stocks/bonds, pension, retirement, etc.) exceeds our limit  _____You are eligible to receive benefits from [facility], [facility] or HIV Medication              Assistance Program _____You are eligible to receive benefits from a Medicare Part "D" plan _____You have prescription insurance  _____You are not an [facility] resident __X__Failure to provide all requested documentation (proof of income for 2022, and/or Patient Intake Application, DOH Attestation, Contract, etc).    Medication assistance will resume once all requested documentation has been returned to our clinic.  If you have questions, please contact our clinic at [phone].    Thank you,  [facility]

## 2021-03-10 ENCOUNTER — Other Ambulatory Visit: Payer: Self-pay

## 2021-03-25 ENCOUNTER — Ambulatory Visit: Payer: Self-pay | Admitting: Family Medicine

## 2021-06-21 ENCOUNTER — Other Ambulatory Visit: Payer: Self-pay

## 2021-06-21 MED FILL — Insulin Aspart Soln Pen-injector 100 Unit/ML: SUBCUTANEOUS | 30 days supply | Qty: 15 | Fill #0 | Status: AC

## 2021-06-21 MED FILL — Insulin Glargine Soln Pen-Injector 100 Unit/ML: SUBCUTANEOUS | 33 days supply | Qty: 6 | Fill #1 | Status: AC

## 2021-10-27 ENCOUNTER — Other Ambulatory Visit: Payer: Self-pay | Admitting: Physician Assistant

## 2021-10-27 ENCOUNTER — Other Ambulatory Visit: Payer: Self-pay

## 2021-10-28 ENCOUNTER — Other Ambulatory Visit: Payer: Self-pay
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
## 2022-07-08 ENCOUNTER — Other Ambulatory Visit: Payer: Self-pay

## 2023-02-24 ENCOUNTER — Other Ambulatory Visit: Payer: Self-pay

## 2024-03-30 ENCOUNTER — Encounter (HOSPITAL_COMMUNITY): Payer: Self-pay

## 2024-03-30 ENCOUNTER — Emergency Department (HOSPITAL_COMMUNITY)
Admission: EM | Admit: 2024-03-30 | Discharge: 2024-03-30 | Disposition: A | Discharge status: Home / Self Care | Attending: Emergency Medicine | Admitting: Emergency Medicine

## 2024-03-30 ENCOUNTER — Other Ambulatory Visit: Payer: Self-pay

## 2024-03-30 DIAGNOSIS — L02511 Cutaneous abscess of right hand: Secondary | ICD-10-CM | POA: Diagnosis not present

## 2024-03-30 DIAGNOSIS — Z794 Long term (current) use of insulin: Secondary | ICD-10-CM | POA: Insufficient documentation

## 2024-03-30 DIAGNOSIS — L03011 Cellulitis of right finger: Secondary | ICD-10-CM

## 2024-03-30 DIAGNOSIS — E119 Type 2 diabetes mellitus without complications: Secondary | ICD-10-CM | POA: Insufficient documentation

## 2024-03-30 DIAGNOSIS — I10 Essential (primary) hypertension: Secondary | ICD-10-CM | POA: Insufficient documentation

## 2024-03-30 DIAGNOSIS — Z79899 Other long term (current) drug therapy: Secondary | ICD-10-CM | POA: Insufficient documentation

## 2024-03-30 DIAGNOSIS — F17210 Nicotine dependence, cigarettes, uncomplicated: Secondary | ICD-10-CM | POA: Diagnosis not present

## 2024-03-30 DIAGNOSIS — M7989 Other specified soft tissue disorders: Secondary | ICD-10-CM | POA: Diagnosis present

## 2024-03-30 MED ORDER — AMOXICILLIN-POT CLAVULANATE 875-125 MG PO TABS
1.0000 | ORAL_TABLET | Freq: Two times a day (BID) | ORAL | 0 refills | Status: AC
Start: 2024-03-30 — End: ?

## 2024-03-30 MED ORDER — OXYCODONE-ACETAMINOPHEN 5-325 MG PO TABS
1.0000 | ORAL_TABLET | Freq: Once | ORAL | Status: AC
  Administered 2024-03-30: 1 via ORAL
  Filled 2024-03-30: qty 1

## 2024-03-30 MED ORDER — IBUPROFEN 200 MG PO TABS
600.0000 mg | ORAL_TABLET | Freq: Once | ORAL | Status: AC
  Administered 2024-03-30: 600 mg via ORAL
  Filled 2024-03-30: qty 3

## 2024-03-30 MED ORDER — AMOXICILLIN-POT CLAVULANATE 875-125 MG PO TABS
1.0000 | ORAL_TABLET | Freq: Once | ORAL | Status: AC
  Administered 2024-03-30: 1 via ORAL
  Filled 2024-03-30: qty 1

## 2024-03-30 MED ORDER — OXYCODONE HCL 5 MG PO TABS
5.0000 mg | ORAL_TABLET | ORAL | 0 refills | Status: DC | PRN
  Filled 2024-03-30: qty 8, 2d supply, fill #0

## 2024-03-30 MED ORDER — OXYCODONE HCL 5 MG PO TABS: 5.0000 mg | ORAL_TABLET | ORAL | 0 refills | Status: AC | PRN

## 2024-03-30 MED ORDER — AMOXICILLIN-POT CLAVULANATE 875-125 MG PO TABS
1.0000 | ORAL_TABLET | Freq: Two times a day (BID) | ORAL | 0 refills | Status: DC
  Filled 2024-03-30: qty 14, 7d supply, fill #0

## 2024-03-30 MED ORDER — LIDOCAINE HCL (PF) 1 % IJ SOLN
5.0000 mL | Freq: Once | INTRAMUSCULAR | Status: AC
  Administered 2024-03-30: 5 mL
  Filled 2024-03-30: qty 30

## 2024-03-30 NOTE — ED Notes (Signed)
 Lidocaine  and drainage tray in the room. MD notified.

## 2024-03-30 NOTE — ED Triage Notes (Signed)
   Patient went to UC two days for infected finger. Patient states they opened her finger and drained it but did not put her on an antibiotic. States it has since started swelling again and painful to touch. Rt middle finger is swollen on observation and tender to touch. Rates pain 10/10. Denies fever.

## 2024-03-30 NOTE — Discharge Instructions (Signed)
 We evaluated you for your finger infection. We drained the collection of pus. We have prescribed you an antibiotic. Please take this as prescribed.   Please take Tylenol  (acetaminophen ) and Motrin  (ibuprofen ) for your symptoms at home.  You can take 1000 mg of Tylenol  every 6 hours and 600 mg of Motrin  every 6 hours as needed for your symptoms.  You can take these medicines together as needed, either at the same time, or alternating every 3 hours.  We have given you a small amount of oxycodone  which you can take for pain not relieved by Tylenol  and Motrin .  Do not drink alcohol, drive, or operate machinery when taking this medicine.  Please soak your finger once daily for 5-15 minutes in warm water. You can also add one capful of betadine solution to 2 quarts of water for soaking.   Please follow up with hand surgery. You can call Dr. Agarwala for follow up. Please return for any worsening symptoms, worsening swelling to the finger, fevers or chills, or any other concerning symptoms.
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
## 2024-04-01 ENCOUNTER — Other Ambulatory Visit (HOSPITAL_COMMUNITY): Payer: Self-pay
